# Patient Record
Sex: Female | Born: 1995
Health system: Southern US, Community
[De-identification: ages and names within clinical notes are randomized; demographics above are authoritative.]

## PROBLEM LIST (undated history)

## (undated) DIAGNOSIS — F32A Depression, unspecified: Secondary | ICD-10-CM

## (undated) DIAGNOSIS — R519 Headache, unspecified: Secondary | ICD-10-CM

## (undated) DIAGNOSIS — F329 Major depressive disorder, single episode, unspecified: Secondary | ICD-10-CM

## (undated) DIAGNOSIS — F419 Anxiety disorder, unspecified: Secondary | ICD-10-CM

## (undated) DIAGNOSIS — F900 Attention-deficit hyperactivity disorder, predominantly inattentive type: Secondary | ICD-10-CM

## (undated) DIAGNOSIS — R51 Headache: Secondary | ICD-10-CM

## (undated) HISTORY — DX: Anxiety disorder, unspecified: F41.9

## (undated) HISTORY — PX: NO PAST SURGERIES: SHX2092

## (undated) HISTORY — DX: Depression, unspecified: F32.A

## (undated) HISTORY — DX: Major depressive disorder, single episode, unspecified: F32.9

## (undated) HISTORY — DX: Attention-deficit hyperactivity disorder, predominantly inattentive type: F90.0

---
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
## 2013-04-25 ENCOUNTER — Encounter (HOSPITAL_COMMUNITY): Payer: Self-pay | Admitting: Emergency Medicine

## 2013-04-25 ENCOUNTER — Emergency Department (HOSPITAL_COMMUNITY)
Admission: EM | Admit: 2013-04-25 | Discharge: 2013-04-25 | Disposition: A | Discharge status: Home / Self Care | Payer: BC Managed Care – PPO | Source: Home / Self Care | Attending: Family Medicine | Admitting: Family Medicine

## 2013-04-25 DIAGNOSIS — F329 Major depressive disorder, single episode, unspecified: Secondary | ICD-10-CM

## 2013-04-25 MED ORDER — MIRTAZAPINE 15 MG PO TABS: 15.0000 mg | ORAL_TABLET | Freq: Every day | ORAL | Status: DC

## 2013-04-25 NOTE — ED Notes (Signed)
Patient has a history of depression, has ran out of medication.  Ran out of medication approx 2 weeks ago.  Patient is a Consulting civil engineer in the area.

## 2013-04-25 NOTE — ED Provider Notes (Signed)
Cassidy Livingston is a 17 y.o. female who presents to Urgent Care today for depression. Patient has a history of major depression. She is currently a Printmaker at [employer].  she ran out of Lexapro about 2 weeks ago. She notes that she was having side effects of Lexapro anyway. She notes decreased appetite with Lexapro and it was not working very well. She has a history of one suicide attempt where she took too many pills. This was about 2 years ago. She currently denies any active suicidal ideation but does have a passive death wish. She notes feelings of guilt, anhedonia, decreased appetite, sleeping too much. She is an appointment with student health counseling service in several days. She feels well otherwise. Her parents are actively involved in her life.   Positive family history for unipolar depression. No personal or family history for bipolar depression and schizophrenia  History reviewed. No pertinent past medical history. History  Substance Use Topics  . Smoking status: Never Smoker   . Smokeless tobacco: Not on file  . Alcohol Use: No   ROS as above Medications reviewed. No current facility-administered medications for this encounter.   Current Outpatient Prescriptions  Medication Sig Dispense Refill  . [DISCONTINUED] Escitalopram Oxalate (LEXAPRO PO) Take by mouth.      . mirtazapine (REMERON) 15 MG tablet Take 1 tablet (15 mg total) by mouth at bedtime.  30 tablet  0    Exam:  BP 120/85  Pulse 72  Temp(Src) 98.2 F (36.8 C) (Oral)  Resp 16  SpO2 100% Gen: Well NAD PSYCH:  Alert and oriented Normal affect Slightly fidgety Good eye contact Normal speech Thought process is linear and goal-directed Patient has good insight No active SI/HI or plan. She does have mild passive death wish.  No hallucinations or delusions.   PHQ9 20/28  Assessment and Plan: 17 y.o. female with major depression. Nearing severe. Plan to transition to mirtazapine as this may be her appetite and  may be more effective than SSRI.  Patient will start at 15 mg each bedtime. She would additionally will followup with the student health counseling service ASAP She agreed for a verbal contract for safety.  She may return any time if she is unable to get the help she needs Discussed warning signs or symptoms. Please see discharge instructions. Patient expresses understanding.     Rodolph Bong, MD 04/25/13 2158

## 2013-04-27 ENCOUNTER — Telehealth (HOSPITAL_COMMUNITY): Payer: Self-pay | Admitting: Family Medicine

## 2013-04-27 NOTE — ED Notes (Signed)
Called to check in about depression.  Left a message.    Rodolph Bong, MD 04/27/13 [phone]

## 2013-11-07 DIAGNOSIS — F32A Depression, unspecified: Secondary | ICD-10-CM | POA: Insufficient documentation

## 2013-11-07 DIAGNOSIS — F419 Anxiety disorder, unspecified: Secondary | ICD-10-CM | POA: Insufficient documentation

## 2013-11-07 DIAGNOSIS — F329 Major depressive disorder, single episode, unspecified: Secondary | ICD-10-CM | POA: Insufficient documentation

## 2014-04-21 ENCOUNTER — Emergency Department (HOSPITAL_COMMUNITY)
Admission: EM | Admit: 2014-04-21 | Discharge: 2014-04-22 | Disposition: A | Discharge status: Home / Self Care | Payer: BC Managed Care – PPO | Attending: Emergency Medicine | Admitting: Emergency Medicine

## 2014-04-21 ENCOUNTER — Encounter (HOSPITAL_COMMUNITY): Payer: Self-pay | Admitting: Emergency Medicine

## 2014-04-21 ENCOUNTER — Emergency Department (HOSPITAL_COMMUNITY): Payer: BC Managed Care – PPO

## 2014-04-21 DIAGNOSIS — Z3202 Encounter for pregnancy test, result negative: Secondary | ICD-10-CM | POA: Insufficient documentation

## 2014-04-21 DIAGNOSIS — R5383 Other fatigue: Secondary | ICD-10-CM | POA: Insufficient documentation

## 2014-04-21 DIAGNOSIS — J020 Streptococcal pharyngitis: Secondary | ICD-10-CM | POA: Diagnosis not present

## 2014-04-21 DIAGNOSIS — Z79899 Other long term (current) drug therapy: Secondary | ICD-10-CM | POA: Insufficient documentation

## 2014-04-21 DIAGNOSIS — R000 Tachycardia, unspecified: Secondary | ICD-10-CM | POA: Diagnosis not present

## 2014-04-21 DIAGNOSIS — R05 Cough: Secondary | ICD-10-CM | POA: Insufficient documentation

## 2014-04-21 DIAGNOSIS — H53149 Visual discomfort, unspecified: Secondary | ICD-10-CM | POA: Diagnosis not present

## 2014-04-21 DIAGNOSIS — T701XXA Sinus barotrauma, initial encounter: Secondary | ICD-10-CM | POA: Insufficient documentation

## 2014-04-21 DIAGNOSIS — R059 Cough, unspecified: Secondary | ICD-10-CM

## 2014-04-21 DIAGNOSIS — R110 Nausea: Secondary | ICD-10-CM | POA: Insufficient documentation

## 2014-04-21 DIAGNOSIS — R51 Headache: Secondary | ICD-10-CM | POA: Diagnosis present

## 2014-04-21 MED ORDER — ONDANSETRON HCL 4 MG/2ML IJ SOLN
4.0000 mg | Freq: Once | INTRAMUSCULAR | Status: AC
  Administered 2014-04-22: 4 mg via INTRAVENOUS
  Filled 2014-04-21: qty 2

## 2014-04-21 MED ORDER — SODIUM CHLORIDE 0.9 % IV BOLUS (SEPSIS)
1000.0000 mL | Freq: Once | INTRAVENOUS | Status: AC
  Administered 2014-04-21: 1000 mL via INTRAVENOUS

## 2014-04-21 MED ORDER — MORPHINE SULFATE 4 MG/ML IJ SOLN
4.0000 mg | Freq: Once | INTRAMUSCULAR | Status: AC
  Administered 2014-04-22: 4 mg via INTRAVENOUS
  Filled 2014-04-21: qty 1

## 2014-04-21 MED ORDER — IBUPROFEN 800 MG PO TABS
800.0000 mg | ORAL_TABLET | Freq: Once | ORAL | Status: AC
Start: 2014-04-21 — End: 2014-04-22
  Administered 2014-04-22: 800 mg via ORAL
  Filled 2014-04-21: qty 1

## 2014-04-21 NOTE — ED Provider Notes (Signed)
CSN: [id number]     Arrival date & time 04/21/14  2210 History   First MD Initiated Contact with Patient 04/21/14 2232     Chief Complaint  Patient presents with  . Headache     (Consider location/radiation/quality/duration/timing/severity/associated sxs/prior Treatment) HPI Comments: Patient is an otherwise healthy 18 year old female who presents emergency department for sudden onset of sore throat and generalized malaise. She reports that she has a frontal headache which has gradually worsened throughout the day. She has associated phonophobia and photophobia. She denies fever, but states she has chills. She has dry cough. She took a Tylenol at 9:15 PM. This did not improve her symptoms.  The history is provided by the patient. No language interpreter was used.    History reviewed. No pertinent past medical history. History reviewed. No pertinent past surgical history. History reviewed. No pertinent family history. History  Substance Use Topics  . Smoking status: Never Smoker   . Smokeless tobacco: Not on file  . Alcohol Use: No   OB History   Grav Para Term Preterm Abortions TAB SAB Ect Mult Living                 Review of Systems  Constitutional: Positive for fever, chills and fatigue.  HENT: Positive for sinus pressure and sore throat. Negative for trouble swallowing.   Eyes: Positive for photophobia. Negative for visual disturbance.  Respiratory: Positive for cough. Negative for shortness of breath.   Cardiovascular: Negative for chest pain.  Gastrointestinal: Positive for nausea. Negative for vomiting and abdominal pain.  Musculoskeletal: Negative for neck stiffness.  Neurological: Positive for headaches.  All other systems reviewed and are negative.     Allergies  Review of patient's allergies indicates no known allergies.  Home Medications   Prior to Admission medications   Medication Sig Start Date End Date Taking? Authorizing Provider  acetaminophen  (TYLENOL) 500 MG tablet Take 1,000 mg by mouth every 6 (six) hours as needed for moderate pain.   Yes Historical Provider, MD  MELATONIN ER PO Take 1 tablet by mouth at bedtime as needed (sleep).   Yes Historical Provider, MD  NUVARING 0.12-0.015 MG/24HR vaginal ring Place 1 each vaginally every 28 (twenty-eight) days.  04/17/14  Yes Historical Provider, MD  tacrolimus (PROTOPIC) 0.1 % ointment Apply 1 application topically at bedtime.   Yes Historical Provider, MD   BP 149/99  Pulse 123  Temp(Src) 98.4 F (36.9 C) (Oral)  Resp 20  Ht 5\' 1"  (1.549 m)  Wt 112 lb (50.803 kg)  BMI 21.17 kg/m2  SpO2 100%  LMP 04/05/2014 Physical Exam  Nursing note and vitals reviewed. Constitutional: She is oriented to person, place, and time. She appears well-developed and well-nourished. No distress.  NAD, non toxic, non septic in appearance.   HENT:  Head: Normocephalic and atraumatic.  Right Ear: External ear normal.  Left Ear: External ear normal.  Nose: Right sinus exhibits maxillary sinus tenderness. Right sinus exhibits no frontal sinus tenderness. Left sinus exhibits maxillary sinus tenderness. Left sinus exhibits no frontal sinus tenderness.  Mouth/Throat: Uvula is midline. Mucous membranes are dry. Oropharyngeal exudate, posterior oropharyngeal edema and posterior oropharyngeal erythema present. No tonsillar abscesses.  Bilateral tonsillar enlargement with exudate. No trismus, submental edema, or tongue elevation   Eyes: Conjunctivae and EOM are normal. Pupils are equal, round, and reactive to light.  Neck: Normal range of motion.  No nuchal rigidity or meningeal signs  Cardiovascular: Regular rhythm, normal heart sounds, intact distal  pulses and normal pulses.  Tachycardia present.   Pulses:      Radial pulses are 2+ on the right side, and 2+ on the left side.       Posterior tibial pulses are 2+ on the right side, and 2+ on the left side.  Pulmonary/Chest: Effort normal and breath sounds  normal. No stridor. No respiratory distress. She has no wheezes. She has no rales.  Abdominal: Soft. She exhibits no distension. There is no tenderness.  Musculoskeletal: Normal range of motion.  Neurological: She is alert and oriented to person, place, and time. She has normal strength. Coordination and gait normal. GCS eye subscore is 4. GCS verbal subscore is 5. GCS motor subscore is 6.  Skin: Skin is warm and dry. She is not diaphoretic. No erythema.  Psychiatric: She has a normal mood and affect. Her behavior is normal.    ED Course  Procedures (including critical care time) Labs Review Labs Reviewed  RAPID STREP SCREEN - Abnormal; Notable for the following:    Streptococcus, Group A Screen (Direct) POSITIVE (*)    All other components within normal limits  CBC WITH DIFFERENTIAL - Abnormal; Notable for the following:    WBC 14.0 (*)    HCT 35.2 (*)    Neutrophils Relative % 85 (*)    Neutro Abs 11.9 (*)    Lymphocytes Relative 10 (*)    All other components within normal limits  COMPREHENSIVE METABOLIC PANEL - Abnormal; Notable for the following:    Sodium 134 (*)    All other components within normal limits  URINALYSIS, ROUTINE W REFLEX MICROSCOPIC - Abnormal; Notable for the following:    Specific Gravity, Urine 1.034 (*)    Ketones, ur 40 (*)    All other components within normal limits  MONONUCLEOSIS SCREEN  I-STAT CG4 LACTIC ACID, ED  POC URINE PREG, ED    Imaging Review Dg Chest 2 View  04/22/2014   CLINICAL DATA:  Woke up this morning with sore throat, headache, body aches.  EXAM: CHEST  2 VIEW  COMPARISON:  None.  FINDINGS: The heart size and mediastinal contours are within normal limits. Both lungs are clear. The visualized skeletal structures are unremarkable.  IMPRESSION: No active cardiopulmonary disease.   Electronically Signed   By: Charlett NoseKevin  Dover M.D.   On: 04/22/2014 00:17     EKG Interpretation None      MDM   Final diagnoses:  Cough  Strep throat     Pt febrile with tonsillar exudate, cervical lymphadenopathy, & dysphagia; diagnosis of strep. Treated in the Ed with pain medication, fluids, and PO penicillin. Patient declined Bicillin injection.  Pt appears dehydrated. She is febrile and tachycardic. Responded well with fluids and tylenol. Discussed importance of fluid rehydration and antipyretics. Presentation non concerning for PTA or infxn spread to soft tissue. No trismus or uvula deviation. Specific return precautions discussed. Pt able to drink water in ED without difficulty with intact air way. Recommended PCP follow up. Patient is persistently tachycardic at shift change. Patient is responding well to fluids, but it appears patient has only received approximately 500ml. Patient signed out to [facility] LLC[facility]alski, PA-C at change of shift to ensure HR normalizes.      Mora BellmanHannah S Anysia Choi, PA-C 04/22/14 2008  Mora BellmanHannah S Maricsa Sammons, [place] JerseyPA-C 04/22/14 2010

## 2014-04-21 NOTE — ED Notes (Signed)
Pt woke up this am w/ sore throat, headache and body aches.  Pt endorsee sensitivity to sound, denies photophobia.

## 2014-04-22 LAB — URINALYSIS, ROUTINE W REFLEX MICROSCOPIC
Bilirubin Urine: NEGATIVE
Glucose, UA: NEGATIVE mg/dL
HGB URINE DIPSTICK: NEGATIVE
KETONES UR: 40 mg/dL — AB
Leukocytes, UA: NEGATIVE
Nitrite: NEGATIVE
Protein, ur: NEGATIVE mg/dL
Specific Gravity, Urine: 1.034 — ABNORMAL HIGH (ref 1.005–1.030)
UROBILINOGEN UA: 1 mg/dL (ref 0.0–1.0)
pH: 6.5 (ref 5.0–8.0)

## 2014-04-22 LAB — COMPREHENSIVE METABOLIC PANEL
ALK PHOS: 54 U/L (ref 39–117)
ALT: 8 U/L (ref 0–35)
AST: 15 U/L (ref 0–37)
Albumin: 3.9 g/dL (ref 3.5–5.2)
Anion gap: 15 (ref 5–15)
BUN: 10 mg/dL (ref 6–23)
CALCIUM: 8.9 mg/dL (ref 8.4–10.5)
CHLORIDE: 99 meq/L (ref 96–112)
CO2: 20 mEq/L (ref 19–32)
Creatinine, Ser: 0.74 mg/dL (ref 0.50–1.10)
GFR calc non Af Amer: >90 mL/min (ref >90)
Glucose, Bld: 91 mg/dL (ref 70–99)
Potassium: 3.7 mEq/L (ref 3.7–5.3)
Sodium: 134 mEq/L — ABNORMAL LOW (ref 137–147)
TOTAL PROTEIN: 7.1 g/dL (ref 6.0–8.3)
Total Bilirubin: 0.4 mg/dL (ref 0.3–1.2)

## 2014-04-22 LAB — CBC WITH DIFFERENTIAL/PLATELET
BASOS PCT: 0 % (ref 0–1)
Basophils Absolute: 0 10*3/uL (ref 0.0–0.1)
EOS ABS: 0.1 10*3/uL (ref 0.0–0.7)
Eosinophils Relative: 0 % (ref 0–5)
HEMATOCRIT: 35.2 % — AB (ref 36.0–46.0)
HEMOGLOBIN: 12.1 g/dL (ref 12.0–15.0)
LYMPHS ABS: 1.4 10*3/uL (ref 0.7–4.0)
LYMPHS PCT: 10 % — AB (ref 12–46)
MCH: 28 pg (ref 26.0–34.0)
MCHC: 34.4 g/dL (ref 30.0–36.0)
MCV: 81.5 fL (ref 78.0–100.0)
MONO ABS: 0.8 10*3/uL (ref 0.1–1.0)
Monocytes Relative: 5 % (ref 3–12)
NEUTROS ABS: 11.9 10*3/uL — AB (ref 1.7–7.7)
NEUTROS PCT: 85 % — AB (ref 43–77)
PLATELETS: 237 10*3/uL (ref 150–400)
RBC: 4.32 MIL/uL (ref 3.87–5.11)
RDW: 12.9 % (ref 11.5–15.5)
WBC: 14 10*3/uL — ABNORMAL HIGH (ref 4.0–10.5)

## 2014-04-22 LAB — MONONUCLEOSIS SCREEN: Mono Screen: NEGATIVE

## 2014-04-22 LAB — POC URINE PREG, ED: Preg Test, Ur: NEGATIVE

## 2014-04-22 LAB — I-STAT CG4 LACTIC ACID, ED: LACTIC ACID, VENOUS: 0.74 mmol/L (ref 0.5–2.2)

## 2014-04-22 LAB — RAPID STREP SCREEN (MED CTR MEBANE ONLY): Streptococcus, Group A Screen (Direct): POSITIVE — AB

## 2014-04-22 MED ORDER — SODIUM CHLORIDE 0.9 % IV BOLUS (SEPSIS)
1000.0000 mL | Freq: Once | INTRAVENOUS | Status: AC
  Administered 2014-04-22: 1000 mL via INTRAVENOUS

## 2014-04-22 MED ORDER — PENICILLIN G BENZATHINE 1200000 UNIT/2ML IM SUSP
1.2000 10*6.[IU] | Freq: Once | INTRAMUSCULAR | Status: DC
  Filled 2014-04-22: qty 2

## 2014-04-22 MED ORDER — TRAMADOL HCL 50 MG PO TABS: 50.0000 mg | ORAL_TABLET | Freq: Four times a day (QID) | ORAL | Status: DC | PRN

## 2014-04-22 MED ORDER — PENICILLIN V POTASSIUM 500 MG PO TABS
500.0000 mg | ORAL_TABLET | Freq: Once | ORAL | Status: AC
  Administered 2014-04-22: 500 mg via ORAL
  Filled 2014-04-22: qty 1

## 2014-04-22 MED ORDER — ACETAMINOPHEN 500 MG PO TABS
1000.0000 mg | ORAL_TABLET | Freq: Once | ORAL | Status: AC
  Administered 2014-04-22: 1000 mg via ORAL
  Filled 2014-04-22: qty 2

## 2014-04-22 MED ORDER — PENICILLIN V POTASSIUM 500 MG PO TABS: 500.0000 mg | ORAL_TABLET | Freq: Four times a day (QID) | ORAL | Status: DC

## 2014-04-22 NOTE — ED Notes (Signed)
Patient transported to X-ray 

## 2014-04-22 NOTE — ED Notes (Signed)
Awake. Verbally responsive. Resp even and unlabored. No audible adventitious breath sounds noted at this time. ABC's intact. NAD noted.

## 2014-04-22 NOTE — Discharge Instructions (Signed)

## 2014-04-22 NOTE — ED Provider Notes (Signed)
Medical screening examination/treatment/procedure(s) were performed by non-physician practitioner and as supervising physician I was immediately available for consultation/collaboration.   EKG Interpretation None        Tomasita CrumbleAdeleke Abdirahim Flavell, MD 04/22/14 2255

## 2014-07-15 ENCOUNTER — Encounter (HOSPITAL_COMMUNITY): Payer: Self-pay | Admitting: Emergency Medicine

## 2015-01-23 IMAGING — CR DG CHEST 2V
2 series · 2 of 2 positions shown · non-contrast
Comparison: None.

CLINICAL DATA: Woke up this morning with sore throat, headache,
body aches.

EXAM:
CHEST  2 VIEW

[w chest pa]
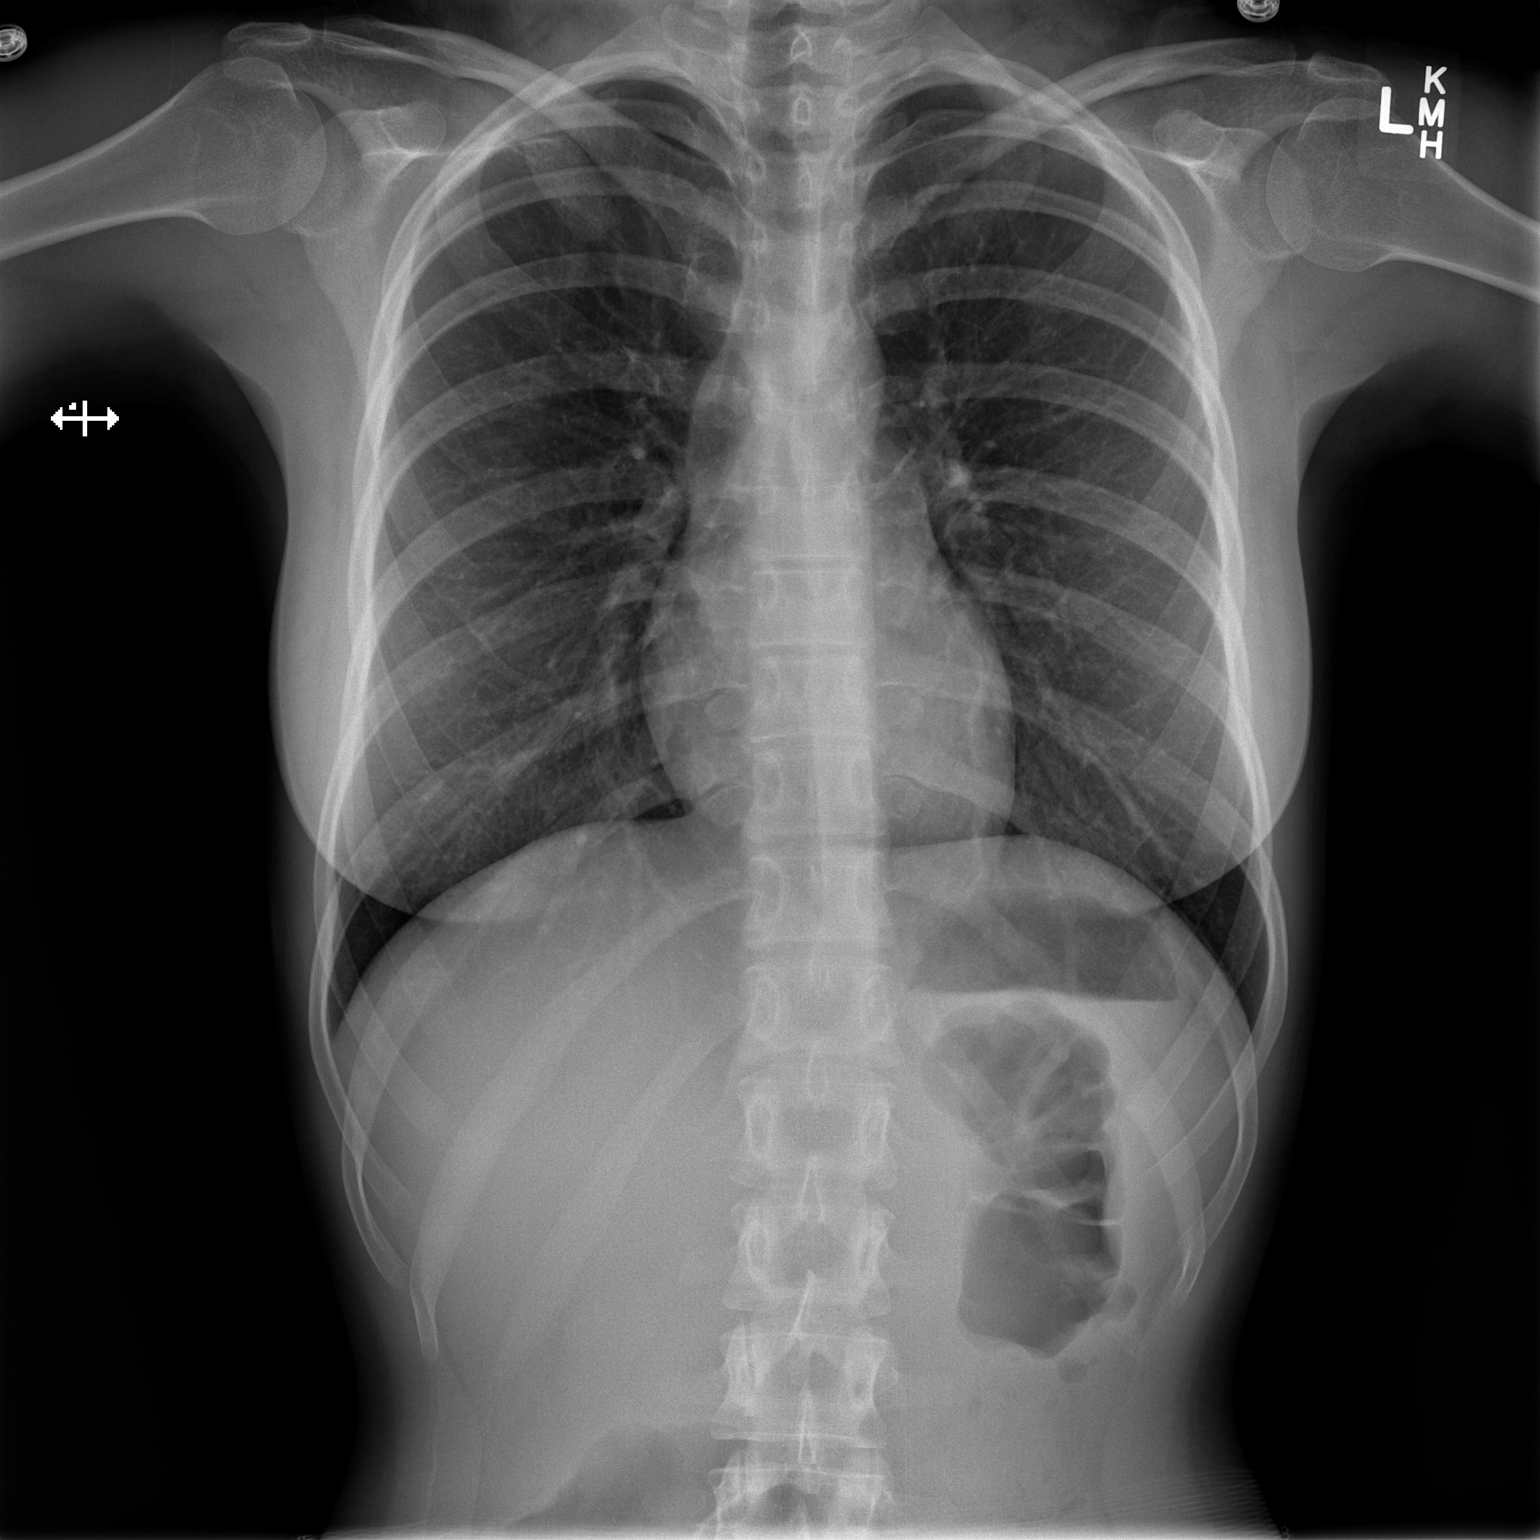

[w chest lat]
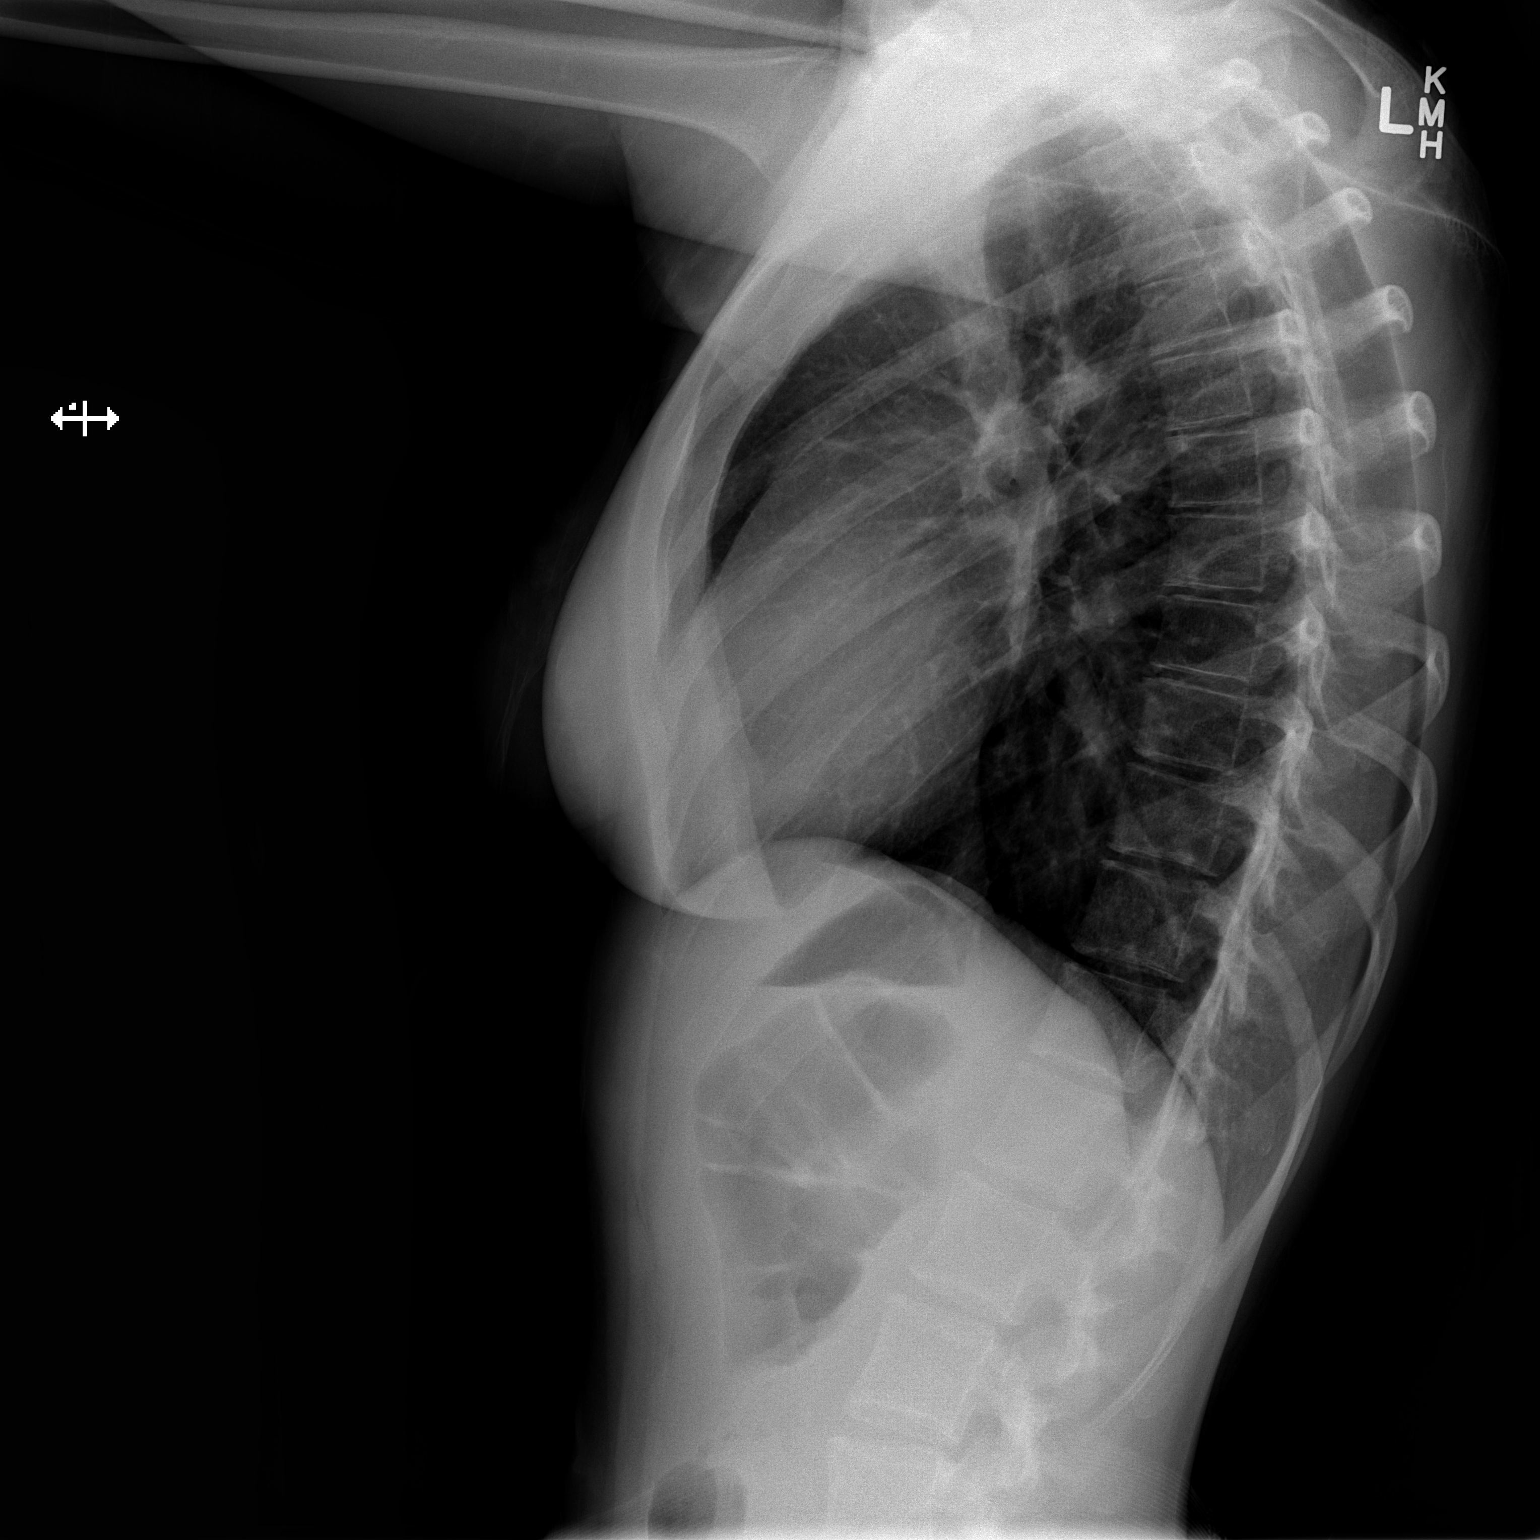

[2 of 2 positions shown; findings below may reference images not displayed]

FINDINGS: The heart size and mediastinal contours are within normal limits.
Both lungs are clear. The visualized skeletal structures are
unremarkable.
IMPRESSION: No active cardiopulmonary disease.

## 2017-03-28 ENCOUNTER — Encounter (HOSPITAL_COMMUNITY): Payer: Self-pay | Admitting: Behavioral Health

## 2017-03-28 ENCOUNTER — Ambulatory Visit (HOSPITAL_COMMUNITY)
Admission: RE | Admit: 2017-03-28 | Discharge: 2017-03-28 | Disposition: A | Discharge status: Home / Self Care | Payer: Self-pay | Attending: Psychiatry | Admitting: Psychiatry

## 2017-03-28 NOTE — H&P (Signed)
Behavioral Health Medical Screening Exam  Cassidy Livingston is an 21 y.o. female who arrived voluntarily unaccompanied to [facility] requesting for predication Rx. Patient became tearful during this encounter, she stated that she feels alone. Her parents and brother live in [place]. Patient recently broke up with her boyfriend about 3 weeks ago. She currently denies any SI/HI/VAH, she agrees to follow up with OP resources for medication management about her depression. Patient contracts for safety.  Total Time spent with patient: 30 minutes  Psychiatric Specialty Exam: Physical Exam  Constitutional: She is oriented to person, place, and time. She appears well-developed and well-nourished.  HENT:  Head: Normocephalic.  Eyes: Pupils are equal, round, and reactive to light.  Neck: Normal range of motion. Neck supple.  Cardiovascular: Normal rate, regular rhythm and normal heart sounds.   Respiratory: Effort normal and breath sounds normal.  GI: Soft. Bowel sounds are normal.  Musculoskeletal: Normal range of motion.  Neurological: She is alert and oriented to person, place, and time.  Skin: Skin is warm and dry.    Review of Systems  Psychiatric/Behavioral: Positive for depression. Negative for hallucinations, memory loss, substance abuse and suicidal ideas. The patient is nervous/anxious. The patient does not have insomnia.   All other systems reviewed and are negative.   Blood pressure 125/81, pulse 78, temperature 98.9 F (37.2 C), temperature source Oral, resp. rate 18, SpO2 100 %.There is no height or weight on file to calculate BMI.  General Appearance: Casual and Well Groomed  Eye Contact:  Good  Speech:  Clear and Coherent and Normal Rate  Volume:  Normal  Mood:  Anxious and Depressed  Affect:  Depressed and Tearful  Thought Process:  Coherent and Goal Directed  Orientation:  Full (Time, Place, and Person)  Thought Content:  WDL and Logical  Suicidal Thoughts:  No  Homicidal  Thoughts:  No  Memory:  Immediate;   Good Recent;   Good Remote;   Good  Judgement:  Good  Insight:  Good  Psychomotor Activity:  Normal  Concentration: Concentration: Good and Attention Span: Good  Recall:  Good  Fund of Knowledge:Good  Language: Good  Akathisia:  Negative  Handed:  Right  AIMS (if indicated):     Assets:  Communication Skills Desire for Improvement Financial Resources/Insurance Housing Leisure Time Physical Health Resilience Social Support Talents/Skills Vocational/Educational  Sleep:       Musculoskeletal: Strength & Muscle Tone: within normal limits Gait & Station: normal Patient leans: N/A  Blood pressure 125/81, pulse 78, temperature 98.9 F (37.2 C), temperature source Oral, resp. rate 18, SpO2 100 %.  Recommendations:  Based on my evaluation the patient does not appear to have an emergency medical condition.  Delila Pereyra, NP 03/28/2017, 5:40 PM

## 2017-03-28 NOTE — BH Assessment (Signed)
Assessment Note  Cassidy Livingston is a 21 y.o. female who presented to [facility] as a voluntary walk-in with complaint of depressive symptoms and anxiety.  Pt requested assistance in restarting psychotropic medications.  Pt provided history.  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  She lives in an apartment with several apartment-mates.  Pt reported that she has a history of therapy and psychiatric treatment for depression, anxiety, and ADHD.  Pt reported that she has not been on psychotropic medication for several years.  Recently, her boyfriend broke up with her, and since then, Pt has experienced an increase in the following symptoms:  Persistent and unremitting despondency; insomnia; appetite (lost six pounds over two weeks); isolation; tearfulness; anxiety; worthlessness and hopelessness.  Pt also reported vegetative disturbance -- difficulty getting out of bed.  Pt reported that she also experiences passive suicidal ideation.  She stated that about two weeks ago, she intentionally overdosed on sleeping pills and ibuprofen -- it was not a suicide attempt, but she stated that she would not mind if she did not wake up.  Pt denied current suicidal ideation, homicidal ideation, auditory/visual hallucination, and self-injurious behavior.  Pt reported that she [facility] (therapist is [facility]).  Pt stated that she is not receiving psychiatric services.  She reported that she was treated inpatient at [facility] her junior year of high school.  Pt has family members in [place], and they provide support for her.  During assessment, Pt presented as alert and oriented.  She had good eye contact and was cooperative.  Pt was dressed in street clothes and appeared appropriately groomed.  Pt's mood was sad.  Affect was mood-congruent.  Pt endorsed passive suicidal ideation (although not currently);  despondency; anxiety, and other depressive symptoms.  She also endorsed a history of ADHD.  Pt denied current SI, HI, or auditory/visual hallucination.  Pt's speech was normal in rate, rhythm, and volume.  Pt's thought processes were within normal range, and thought content was logical and goal-oriented.  There was no evidence of delusion.  Pt's memory and concentration were intact.  Pt's insight and judgment were fair.  Impulse control was deemed poor as evidenced by overdose last week.  Pt requested assistance in securing psychotropic meds to help with despondency due to recent break-up.  Consulted with Julian Hy, NP who determined that Pt does not meet inpatient criteria.  Author provided Pt with psychotropic medication.  Diagnosis: Major Depressive Disorder, Recurrent, Moderate  Past Medical History: No past medical history on file.  No past surgical history on file.  Family History: No family history on file.  Social History:  reports that she has never smoked. She does not have any smokeless tobacco history on file. She reports that she drinks alcohol. She reports that she does not use drugs.  Additional Social History:  Alcohol / Drug Use Pain Medications: See MAR Prescriptions: See MAR Over the Counter: See MAR History of alcohol / drug use?: Yes Substance #1 Name of Substance 1: Alcohol 1 - Amount (size/oz): Varied 1 - Frequency: Episodic 1 - Duration: Ongoing  CIWA: CIWA-Ar BP: 125/81 Pulse Rate: 78 COWS:    Allergies: No Known Allergies  Home Medications:  (Not in a hospital admission)  OB/GYN Status:  No LMP recorded.  General Assessment Data Location of Assessment: [facility] Assessment Services TTS Assessment: In system Is this a Tele or Face-to-Face Assessment?: Face-to-Face Is this  an Initial Assessment or a Re-assessment for this encounter?: Initial Assessment Marital status: Single Is patient pregnant?: No Pregnancy Status: No Living Arrangements: Other  (Comment) (Lives in apartment; has roommates) Can pt return to current living arrangement?: Yes Admission Status: Voluntary Is patient capable of signing voluntary admission?: Yes Referral Source: Self/Family/Friend Insurance type: Scientist, research (physical sciences) Exam [facility] Walk-in ONLY) Medical Exam completed: Yes  Crisis Care Plan Living Arrangements: Other (Comment) (Lives in apartment; has roommates) Name of Psychiatrist: None currently Name of Therapist: Engineer, manufacturing systems, [facility]  Education Status Is patient currently in school?: Yes Current Grade: Junior Highest grade of school patient has completed: Sophomore Name of school: [employer]  Risk to self with the past 6 months Suicidal Ideation: No-Not Currently/Within Last 6 Months Has patient been a risk to self within the past 6 months prior to admission? : Yes Suicidal Intent: No Has patient had any suicidal intent within the past 6 months prior to admission? : Yes Is patient at risk for suicide?: No Suicidal Plan?: No Has patient had any suicidal plan within the past 6 months prior to admission? : No Access to Means: No What has been your use of drugs/alcohol within the last 12 months?: Alcohol Previous Attempts/Gestures: Yes How many times?: 1 Triggers for Past Attempts: Other (Comment) (Breakup with boyfriend) Intentional Self Injurious Behavior: None Family Suicide History: No Recent stressful life event(s): Other (Comment) (Recent breakup with boyfriend) Persecutory voices/beliefs?: No Depression: Yes Depression Symptoms: Despondent, Insomnia, Tearfulness, Isolating, Loss of interest in usual pleasures, Feeling worthless/self pity Substance abuse history and/or treatment for substance abuse?: No Suicide prevention information given to non-admitted patients: Not applicable  Risk to Others within the past 6 months Homicidal Ideation: No Does patient have any lifetime risk of violence toward others  beyond the six months prior to admission? : No Thoughts of Harm to Others: No Current Homicidal Intent: No Current Homicidal Plan: No Access to Homicidal Means: No History of harm to others?: No Assessment of Violence: None Noted Does patient have access to weapons?: No Criminal Charges Pending?: No Does patient have a court date: No Is patient on probation?: No  Psychosis Hallucinations: None noted Delusions: None noted  Mental Status Report Appearance/Hygiene: Unremarkable (Street clothes) Eye Contact: Good Motor Activity: Freedom of movement, Unremarkable Speech: Logical/coherent Level of Consciousness: Alert Mood: Sad Affect: Appropriate to circumstance Anxiety Level: None Thought Processes: Coherent, Relevant Judgement: Partial Orientation: Person, Place, Time, Situation Obsessive Compulsive Thoughts/Behaviors: None  Cognitive Functioning Concentration: Normal Memory: Recent Intact, Remote Intact IQ: Average Insight: Fair Impulse Control: Poor Appetite: Poor Weight Loss: 6 (six lbs over 2 weeks) Sleep: Decreased Vegetative Symptoms: Staying in bed  ADLScreening [facility] Assessment Services) Patient's cognitive ability adequate to safely complete daily activities?: Yes Patient able to express need for assistance with ADLs?: Yes Independently performs ADLs?: Yes (appropriate for developmental age)  Prior Inpatient Therapy Prior Inpatient Therapy: Yes Prior Therapy Dates: 2009 Prior Therapy Facilty/Provider(s): [facility] Reason for Treatment: Depression  Prior Outpatient Therapy Prior Outpatient Therapy: Yes Prior Therapy Dates: Ongoing Prior Therapy Facilty/Provider(s): [facility] Reason for Treatment: Depression, Anxiety, ADHD Does patient have an ACCT team?: No Does patient have Intensive In-House Services?  : No Does patient have [facility] services? : No Does patient have P4CC services?: No  ADL Screening (condition at time  of admission) Patient's cognitive ability adequate to safely complete daily activities?: Yes Is the patient deaf or have difficulty hearing?: No Does the patient have  difficulty seeing, even when wearing glasses/contacts?: No Does the patient have difficulty concentrating, remembering, or making decisions?: No Patient able to express need for assistance with ADLs?: Yes Does the patient have difficulty dressing or bathing?: No Independently performs ADLs?: Yes (appropriate for developmental age) Does the patient have difficulty walking or climbing stairs?: No Weakness of Legs: None Weakness of Arms/Hands: None     Therapy Consults (therapy consults require a physician order) PT Evaluation Needed: No OT Evalulation Needed: No SLP Evaluation Needed: No Abuse/Neglect Assessment (Assessment to be complete while patient is alone) Physical Abuse: Denies Verbal Abuse: Denies Sexual Abuse: Denies Exploitation of patient/patient's resources: Denies Self-Neglect: Denies Values / Beliefs Cultural Requests During Hospitalization: None Spiritual Requests During Hospitalization: None Consults Spiritual Care Consult Needed: No Social Work Consult Needed: No Merchant navy officer (For Healthcare) Does Patient Have a Medical Advance Directive?: No    Additional Information 1:1 In Past 12 Months?: No CIRT Risk: No Elopement Risk: No Does patient have medical clearance?: Yes     Disposition:  Disposition Initial Assessment Completed for this Encounter: Yes Disposition of Patient: Referred to Patient referred to: Other (Comment) (Referred to o/p psych services; resources provided)  On Site Evaluation by:   Reviewed with Physician:    Earline Mayotte 03/28/2017 5:49 PM

## 2017-04-02 ENCOUNTER — Encounter: Payer: Self-pay | Admitting: Physician Assistant

## 2017-04-02 ENCOUNTER — Ambulatory Visit (INDEPENDENT_AMBULATORY_CARE_PROVIDER_SITE_OTHER): Payer: BLUE CROSS/BLUE SHIELD | Admitting: Physician Assistant

## 2017-04-02 VITALS — HR 92 | Temp 98.8°F | Resp 16 | Ht 61.0 in | Wt 116.4 lb

## 2017-04-02 DIAGNOSIS — F329 Major depressive disorder, single episode, unspecified: Secondary | ICD-10-CM | POA: Diagnosis not present

## 2017-04-02 DIAGNOSIS — Z23 Encounter for immunization: Secondary | ICD-10-CM

## 2017-04-02 DIAGNOSIS — Z3009 Encounter for other general counseling and advice on contraception: Secondary | ICD-10-CM | POA: Diagnosis not present

## 2017-04-02 DIAGNOSIS — F900 Attention-deficit hyperactivity disorder, predominantly inattentive type: Secondary | ICD-10-CM | POA: Diagnosis not present

## 2017-04-02 DIAGNOSIS — F419 Anxiety disorder, unspecified: Secondary | ICD-10-CM

## 2017-04-02 DIAGNOSIS — F32A Depression, unspecified: Secondary | ICD-10-CM

## 2017-04-02 MED ORDER — CLONAZEPAM 0.5 MG PO TABS: 0.5000 mg | ORAL_TABLET | Freq: Two times a day (BID) | ORAL | 1 refills | Status: DC | PRN

## 2017-04-02 MED ORDER — VENLAFAXINE HCL ER 37.5 MG PO CP24: ORAL_CAPSULE | ORAL | 1 refills | Status: DC

## 2017-04-02 MED ORDER — ETONOGESTREL-ETHINYL ESTRADIOL 0.12-0.015 MG/24HR VA RING: 1.0000 | VAGINAL_RING | VAGINAL | 12 refills | Status: DC

## 2017-04-02 NOTE — Assessment & Plan Note (Signed)
Discussed treatment options. Select venlafaxine, with PRN clonazepam for the initial 2-3 weeks.

## 2017-04-02 NOTE — Assessment & Plan Note (Signed)
Discussed treatment options. Select venlafaxine, with PRN clonazepam for the initial 2-3 weeks.  

## 2017-04-02 NOTE — Progress Notes (Signed)
Patient ID: Cassidy Livingston, female     DOB: 05-Feb-1996, 21 y.o.    MRN: [id number]  PCP: System, Pcp Not In  Chief Complaint  Patient presents with  . Establish Care    anxiety     Subjective:   This patient is new to me and presents for evaluation of anxiety, on the recommendation of her therapist, Kara Dies. Next visit is 04/06/2017, 2 pm  "Really bad state lately. Been meaning for a while (years) to get back on medication. Anxiety has become crippling." ADHD has worsened and interferes with her ability to do well in school.  ADHD is predominantly inattentive type, initially diagnosed in 5th grade. Dx with depression in early HS.  Took "Zoloft and a bunch of other random depression medications that I can't remember off the top of my head." Doesn't recall what she took for ADHD, but thinks it was off-brand Adderall (thinks that it made it worse, worked really well x 1 hour, then felt "like, blank for the rest of the day."). Doesn't recall anything that worked particularly well, and some things made her symptoms worse.  Is not currently using anything to prevent pregnancy or STI. Has previously used NuvaRing, which she liked. Is interested in Nexplanon.   Review of Systems  Constitutional: Negative.   HENT: Negative for sore throat.   Eyes: Negative for visual disturbance.  Respiratory: Negative for cough, chest tightness, shortness of breath and wheezing.   Cardiovascular: Negative for chest pain and palpitations.  Gastrointestinal: Negative for abdominal pain, diarrhea, nausea and vomiting.  Genitourinary: Negative for dysuria, frequency, hematuria and urgency.  Musculoskeletal: Negative for arthralgias and myalgias.  Skin: Negative for rash.  Neurological: Negative for dizziness, weakness and headaches.  Psychiatric/Behavioral: Positive for decreased concentration, dysphoric mood and suicidal ideas (no intention or plan). Negative for self-injury and sleep  disturbance. The patient is nervous/anxious.    Depression screen PHQ 2/9 04/02/2017  Decreased Interest 3  Down, Depressed, Hopeless 3  PHQ - 2 Score 6  Altered sleeping 2  Tired, decreased energy 3  Change in appetite 3  Feeling bad or failure about yourself  3  Trouble concentrating 3  Moving slowly or fidgety/restless 1  Suicidal thoughts 1  PHQ-9 Score 22  Difficult doing work/chores Very difficult     Prior to Admission medications   Medication Sig Start Date End Date Taking? Authorizing Provider  acetaminophen (TYLENOL) 500 MG tablet Take 1,000 mg by mouth every 6 (six) hours as needed for moderate pain.    [provider]     No Known Allergies   There are no active problems to display for this patient.    Family History  Problem Relation Age of Onset  . Diabetes Mother   . Heart disease Maternal Grandmother      Social History   Social History  . Marital status: Single    Spouse name: n/a  . Number of children: 0  . Years of education: N/A   Occupational History  . student     [employer], anticipated graduation 05/2018   Social History Main Topics  . Smoking status: Never Smoker  . Smokeless tobacco: Never Used  . Alcohol use 0.0 - 2.4 oz/week     Comment: Varied; BAC not available  . Drug use: No  . Sexual activity: Yes    Partners: Male    Birth control/ protection: None   Other Topics Concern  . Not on file  Social History Narrative   Grew up in [place], [place].   Her family lives in [place], [place].   Lives off campus with roommates.             Objective:  Physical Exam  Constitutional: She is oriented to person, place, and time. She appears well-developed and well-nourished. She is active and cooperative. No distress.  Pulse 92   Temp 98.8 F (37.1 C)   Resp 16   Ht  (1.549 m)   Wt 116 lb 6.4 oz (52.8 kg)   LMP 03/23/2017   SpO2 98%   BMI 21.99 kg/m    Eyes: Conjunctivae are normal.    Pulmonary/Chest: Effort normal.  Neurological: She is alert and oriented to person, place, and time.  Psychiatric: She has a normal mood and affect. Her speech is normal and behavior is normal.       Assessment & Plan:   Problem List Items Addressed This Visit    Anxiety - Primary    Discussed treatment options. Select venlafaxine, with PRN clonazepam for the initial 2-3 weeks.       Relevant Medications   venlafaxine XR (EFFEXOR-XR) 37.5 MG 24 hr capsule   clonazePAM (KLONOPIN) 0.5 MG tablet   Depression    Discussed treatment options. Select venlafaxine, with PRN clonazepam for the initial 2-3 weeks.       Relevant Medications   venlafaxine XR (EFFEXOR-XR) 37.5 MG 24 hr capsule   ADHD, predominantly inattentive type    Hold off on reinitiating ADHD treatment to see how much improvement she gets with treating the anxiety and depression. In the interim, she'll see if she can get previous records.       Other Visit Diagnoses    Need for prophylactic vaccination and inoculation against influenza       Relevant Orders   Flu Vaccine QUAD 6+ mos PF IM (Fluarix Quad PF) (Completed)   Encounter for other general counseling or advice on contraception       Resume NuvaRing until we can get prior authorization for Nexplanon. Once that is approved, she will come in during her next period for placement.   Relevant Medications   etonogestrel-ethinyl estradiol (NUVARING) 0.12-0.015 MG/24HR vaginal ring       Return for 4-6 week re-evaluation of anxiety, depression and ADHD.   Fernande Bras, PA-C Primary Care at [facility]

## 2017-04-02 NOTE — Patient Instructions (Signed)
     IF you received an x-ray today, you will receive an invoice from [facility]. Please contact [facility] at [phone] with questions or concerns regarding your invoice.   IF you received labwork today, you will receive an invoice from [facility]. Please contact [facility] at [phone] with questions or concerns regarding your invoice.   Our billing staff will not be able to assist you with questions regarding bills from these companies.  You will be contacted with the lab results as soon as they are available. The fastest way to get your results is to activate your My Chart account. Instructions are located on the last page of this paperwork. If you have not heard from us regarding the results in 2 weeks, please contact this office.     

## 2017-04-02 NOTE — Assessment & Plan Note (Signed)
Hold off on reinitiating ADHD treatment to see how much improvement she gets with treating the anxiety and depression. In the interim, she'll see if she can get previous records.

## 2017-04-30 ENCOUNTER — Ambulatory Visit (INDEPENDENT_AMBULATORY_CARE_PROVIDER_SITE_OTHER): Payer: BLUE CROSS/BLUE SHIELD | Admitting: Physician Assistant

## 2017-04-30 ENCOUNTER — Encounter: Payer: Self-pay | Admitting: Physician Assistant

## 2017-04-30 VITALS — BP 102/80 | HR 88 | Temp 98.7°F | Resp 18 | Ht 61.0 in | Wt 112.8 lb

## 2017-04-30 DIAGNOSIS — F329 Major depressive disorder, single episode, unspecified: Secondary | ICD-10-CM | POA: Diagnosis not present

## 2017-04-30 DIAGNOSIS — F900 Attention-deficit hyperactivity disorder, predominantly inattentive type: Secondary | ICD-10-CM | POA: Diagnosis not present

## 2017-04-30 DIAGNOSIS — F32A Depression, unspecified: Secondary | ICD-10-CM

## 2017-04-30 DIAGNOSIS — F419 Anxiety disorder, unspecified: Secondary | ICD-10-CM

## 2017-04-30 MED ORDER — LISDEXAMFETAMINE DIMESYLATE 30 MG PO CAPS: 30.0000 mg | ORAL_CAPSULE | Freq: Every day | ORAL | 0 refills | Status: DC

## 2017-04-30 MED ORDER — CLONAZEPAM 0.5 MG PO TABS: 0.5000 mg | ORAL_TABLET | Freq: Two times a day (BID) | ORAL | 0 refills | Status: DC | PRN

## 2017-04-30 MED ORDER — BUPROPION HCL ER (XL) 150 MG PO TB24: 150.0000 mg | ORAL_TABLET | Freq: Every day | ORAL | 0 refills | Status: DC

## 2017-04-30 NOTE — Assessment & Plan Note (Signed)
STOP venlafaxine, due to increased depressive symptoms. Re-Try bupropion. Continue PRN clonazepam.

## 2017-04-30 NOTE — Progress Notes (Signed)
Patient ID: Cassidy Livingston, female    DOB: [birth year], 21 y.o.   MRN: [id number]  PCP: System, Pcp Not In  Chief Complaint  Patient presents with  . Anxiety    GAD 7 score 16  . Depression    Depression scale score 22  . ADHD  . Follow-up    Subjective:   Presents for evaluation of anxiety, depression and ADHD.  At her previous visit with me, our first, we started venlafaxine xr, as multiple SSRI's and bupropion had been ineffective. We elected to try to address the mood disorder and then see what attention problem persisted. Trying to get records from previous provider regarding ADHD treatment. Knows that she took an off-brand of Adderall, she thinks it was short acting.  She found that while the venlafaxine has really helped her anxiety symptoms, her depression worsened. She has experienced a decline in motivation such that she just wants to stay in bed and sleep. She cannot always get herself up out of bed to go to class. She is able to get up for rehearsal, but even that is a struggle. No thoughts of harming herself or others. Clonazepam helps "take the edge off" and is not sedating.  She has been more consistent with taking the venlafaxine than with any other medication for mood in the past, and she wonders if the previous ineffectiveness is due to that.   Review of Systems No chest pain, SOB, HA, dizziness, vision change, N/V, diarrhea, constipation, dysuria, urinary urgency or frequency, myalgias, arthralgias or rash.  Depression screen [facility] 2/9 04/30/2017 04/02/2017  Decreased Interest 3 3  Down, Depressed, Hopeless 3 3  PHQ - 2 Score 6 6  Altered sleeping 3 2  Tired, decreased energy 3 3  Change in appetite 3 3  Feeling bad or failure about yourself  3 3  Trouble concentrating 1 3  Moving slowly or fidgety/restless 3 1  Suicidal thoughts 1 1  PHQ-9 Score 23 22  Difficult doing work/chores Extremely dIfficult Very difficult    GAD 7 : Generalized Anxiety  Score 04/30/2017  Nervous, Anxious, on Edge 3  Control/stop worrying 3  Worry too much - different things 3  Trouble relaxing 3  Restless 2  Easily annoyed or irritable 1  Afraid - awful might happen 1  Total GAD 7 Score 16  Anxiety Difficulty Somewhat difficult       Patient Active Problem List   Diagnosis Date Noted  . ADHD, predominantly inattentive type 04/02/2017  . Anxiety 11/07/2013  . Depression 11/07/2013     Prior to Admission medications   Medication Sig Start Date End Date Taking? Authorizing Provider  acetaminophen (TYLENOL) 500 MG tablet Take 1,000 mg by mouth every 6 (six) hours as needed for moderate pain.   Yes [provider]  clonazePAM (KLONOPIN) 0.5 MG tablet Take 1 tablet (0.5 mg total) by mouth 2 (two) times daily as needed for anxiety. 04/02/17  Yes Taiga Lupinacci, Avelino Leedshelle, PA-C  etonogestrel-ethinyl estradiol (NUVARING) 0.12-0.015 MG/24HR vaginal ring Place 1 each vaginally every 28 (twenty-eight) days. 04/02/17  Yes Antania Hoefling, PA-C  venlafaxine XR (EFFEXOR-XR) 37.5 MG 24 hr capsule Take 37.5 mg daily for 1-2 weeks, then take 75 mg daily 04/02/17  Yes Makaylyn Sinyard, PA-C     No Known Allergies     Objective:  Physical Exam  Constitutional: She is oriented to person, place, and time. She appears well-developed and well-nourished. She is active and cooperative. No distress.  BP 102/80 (BP Location: Left Arm, Patient Position: Sitting, Cuff Size: Normal)   Pulse 88   Temp 98.7 F (37.1 C) (Oral)   Resp 18   Ht 5\' 1"  (1.549 m)   Wt 112 lb 12.8 oz (51.2 kg)   LMP 04/20/2017   SpO2 97%   BMI 21.31 kg/m   HENT:  Head: Normocephalic and atraumatic.  Right Ear: Hearing normal.  Left Ear: Hearing normal.  Eyes: Conjunctivae are normal. No scleral icterus.  Neck: Normal range of motion. Neck supple. No thyromegaly present.  Cardiovascular: Normal rate, regular rhythm and normal heart sounds.   Pulses:      Radial pulses are 2+ on the right  side, and 2+ on the left side.  Pulmonary/Chest: Effort normal and breath sounds normal.  Lymphadenopathy:       Head (right side): No tonsillar, no preauricular, no posterior auricular and no occipital adenopathy present.       Head (left side): No tonsillar, no preauricular, no posterior auricular and no occipital adenopathy present.    She has no cervical adenopathy.       Right: No supraclavicular adenopathy present.       Left: No supraclavicular adenopathy present.  Neurological: She is alert and oriented to person, place, and time. No sensory deficit.  Skin: Skin is warm, dry and intact. No rash noted. No cyanosis or erythema. Nails show no clubbing.  Psychiatric: Her speech is normal and behavior is normal. Judgment and thought content normal. Her mood appears anxious. Her affect is not angry, not blunt, not labile and not inappropriate. Cognition and memory are normal. She does not exhibit a depressed mood.      Assessment & Plan:   Problem List Items Addressed This Visit    Anxiety - Primary    STOP venlafaxine, due to increased depressive symptoms. Re-Try bupropion. Continue PRN clonazepam.      Relevant Medications   clonazePAM (KLONOPIN) 0.5 MG tablet   buPROPion (WELLBUTRIN XL) 150 MG 24 hr tablet   Depression    STOP venlafaxine, due to increasing depression, despite improved anxiety. Re-Try bupropion.      Relevant Medications   buPROPion (WELLBUTRIN XL) 150 MG 24 hr tablet   ADHD, predominantly inattentive type    Try Vyvanse.      Relevant Medications   lisdexamfetamine (VYVANSE) 30 MG capsule       Return in about 4 weeks (around 05/28/2017) for re-evaluation of mood, motivation and attention.   Fernande Bras, PA-C Primary Care at [facility]

## 2017-04-30 NOTE — Assessment & Plan Note (Signed)
STOP venlafaxine, due to increasing depression, despite improved anxiety. Re-Try bupropion.

## 2017-04-30 NOTE — Patient Instructions (Signed)
     IF you received an x-ray today, you will receive an invoice from [facility]. Please contact [facility] at [phone] with questions or concerns regarding your invoice.   IF you received labwork today, you will receive an invoice from [facility]. Please contact [facility] at [phone] with questions or concerns regarding your invoice.   Our billing staff will not be able to assist you with questions regarding bills from these companies.  You will be contacted with the lab results as soon as they are available. The fastest way to get your results is to activate your My Chart account. Instructions are located on the last page of this paperwork. If you have not heard from us regarding the results in 2 weeks, please contact this office.     

## 2017-04-30 NOTE — Assessment & Plan Note (Signed)
Try Vyvanse.

## 2017-05-28 ENCOUNTER — Ambulatory Visit: Payer: BLUE CROSS/BLUE SHIELD | Admitting: Physician Assistant

## 2017-08-17 ENCOUNTER — Encounter (HOSPITAL_COMMUNITY): Payer: Self-pay | Admitting: Nurse Practitioner

## 2017-08-17 ENCOUNTER — Emergency Department (HOSPITAL_COMMUNITY)
Admission: EM | Admit: 2017-08-17 | Discharge: 2017-08-18 | Disposition: A | Discharge status: Other Acute Inpatient Hospital | Payer: BLUE CROSS/BLUE SHIELD | Attending: Emergency Medicine | Admitting: Emergency Medicine

## 2017-08-17 ENCOUNTER — Other Ambulatory Visit: Payer: Self-pay

## 2017-08-17 DIAGNOSIS — Z046 Encounter for general psychiatric examination, requested by authority: Secondary | ICD-10-CM | POA: Insufficient documentation

## 2017-08-17 DIAGNOSIS — Z79899 Other long term (current) drug therapy: Secondary | ICD-10-CM | POA: Diagnosis not present

## 2017-08-17 DIAGNOSIS — F332 Major depressive disorder, recurrent severe without psychotic features: Secondary | ICD-10-CM | POA: Diagnosis not present

## 2017-08-17 DIAGNOSIS — T391X2A Poisoning by 4-Aminophenol derivatives, intentional self-harm, initial encounter: Secondary | ICD-10-CM | POA: Diagnosis not present

## 2017-08-17 DIAGNOSIS — F329 Major depressive disorder, single episode, unspecified: Secondary | ICD-10-CM | POA: Diagnosis present

## 2017-08-17 DIAGNOSIS — R45851 Suicidal ideations: Secondary | ICD-10-CM | POA: Diagnosis not present

## 2017-08-17 DIAGNOSIS — T1491XA Suicide attempt, initial encounter: Secondary | ICD-10-CM | POA: Diagnosis not present

## 2017-08-17 LAB — COMPREHENSIVE METABOLIC PANEL
ALK PHOS: 47 U/L (ref 38–126)
ALT: 8 U/L — ABNORMAL LOW (ref 14–54)
AST: 15 U/L (ref 15–41)
Albumin: 4.4 g/dL (ref 3.5–5.0)
Anion gap: 7 (ref 5–15)
BILIRUBIN TOTAL: 0.7 mg/dL (ref 0.3–1.2)
BUN: 11 mg/dL (ref 6–20)
CALCIUM: 9.4 mg/dL (ref 8.9–10.3)
CO2: 26 mmol/L (ref 22–32)
Chloride: 106 mmol/L (ref 101–111)
Creatinine, Ser: 0.7 mg/dL (ref 0.44–1.00)
GFR calc non Af Amer: >60 mL/min (ref >60)
Glucose, Bld: 94 mg/dL (ref 65–99)
Potassium: 4.2 mmol/L (ref 3.5–5.1)
Sodium: 139 mmol/L (ref 135–145)
TOTAL PROTEIN: 7 g/dL (ref 6.5–8.1)

## 2017-08-17 LAB — URINALYSIS, ROUTINE W REFLEX MICROSCOPIC
BILIRUBIN URINE: NEGATIVE
GLUCOSE, UA: NEGATIVE mg/dL
Hgb urine dipstick: NEGATIVE
KETONES UR: NEGATIVE mg/dL
Leukocytes, UA: NEGATIVE
Nitrite: NEGATIVE
PROTEIN: NEGATIVE mg/dL
Specific Gravity, Urine: 1.031 — ABNORMAL HIGH (ref 1.005–1.030)
pH: 5 (ref 5.0–8.0)

## 2017-08-17 LAB — CBC WITH DIFFERENTIAL/PLATELET
BASOS ABS: 0.1 10*3/uL (ref 0.0–0.1)
BASOS PCT: 1 %
Eosinophils Absolute: 0.2 10*3/uL (ref 0.0–0.7)
Eosinophils Relative: 4 %
HCT: 37 % (ref 36.0–46.0)
Hemoglobin: 12.1 g/dL (ref 12.0–15.0)
Lymphocytes Relative: 36 %
Lymphs Abs: 2.3 10*3/uL (ref 0.7–4.0)
MCH: 28.1 pg (ref 26.0–34.0)
MCHC: 32.7 g/dL (ref 30.0–36.0)
MCV: 85.8 fL (ref 78.0–100.0)
MONO ABS: 0.4 10*3/uL (ref 0.1–1.0)
Monocytes Relative: 7 %
Neutro Abs: 3.3 10*3/uL (ref 1.7–7.7)
Neutrophils Relative %: 52 %
PLATELETS: 280 10*3/uL (ref 150–400)
RBC: 4.31 MIL/uL (ref 3.87–5.11)
RDW: 12.7 % (ref 11.5–15.5)
WBC: 6.3 10*3/uL (ref 4.0–10.5)

## 2017-08-17 LAB — RAPID URINE DRUG SCREEN, HOSP PERFORMED
Amphetamines: NOT DETECTED
BARBITURATES: NOT DETECTED
Benzodiazepines: NOT DETECTED
COCAINE: NOT DETECTED
Opiates: NOT DETECTED
Tetrahydrocannabinol: NOT DETECTED

## 2017-08-17 LAB — SALICYLATE LEVEL: Salicylate Lvl: <7 mg/dL (ref 2.8–30.0)

## 2017-08-17 LAB — PREGNANCY, URINE: Preg Test, Ur: NEGATIVE

## 2017-08-17 LAB — ACETAMINOPHEN LEVEL

## 2017-08-17 LAB — ETHANOL: Alcohol, Ethyl (B): <10 mg/dL (ref <10)

## 2017-08-17 MED ORDER — CLONAZEPAM 0.5 MG PO TABS: 0.5000 mg | ORAL_TABLET | Freq: Two times a day (BID) | ORAL | Status: DC | PRN
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
## 2017-08-17 NOTE — ED Notes (Signed)
Report given to RN

## 2017-08-17 NOTE — ED Triage Notes (Signed)
Patient was reported missing yesterday and was contacting ex boyfriend and having thoughts of harming herself by ingesting pills. Patient was found this am [facility]

## 2017-08-17 NOTE — ED Provider Notes (Signed)
[facility]-EMERGENCY DEPT Provider Note   CSN: [id number] Arrival date & time: 08/17/17  1315     History   Chief Complaint No chief complaint on file.   HPI Cassidy Livingston is a 22 y.o. female.  HPI Patient presents  under IVC.  Had been missing for the last 2 days.  She is a Consulting civil engineer at [employer].  Reportedly found and had been making threatening text to an ex-boyfriend.  Had told [employer] that she had taken 8 ibuprofen.  Told [employer] police that she did not take anything and told me she did not take anything.  Admits to some suicidal thoughts.  Has a history of depression.  Denies substance abuse.  Denies possibility of pregnancy. Past Medical History:  Diagnosis Date  . ADHD, predominantly inattentive type   . Anxiety   . Depression     Patient Active Problem List   Diagnosis Date Noted  . ADHD, predominantly inattentive type 04/02/2017  . Anxiety 11/07/2013  . Depression 11/07/2013    Past Surgical History:  Procedure Laterality Date  . NO PAST SURGERIES      OB History    Gravida Para Term Preterm AB Living   0 0 0 0 0     SAB TAB Ectopic Multiple Live Births   0 0 0           Home Medications    Prior to Admission medications   Medication Sig Start Date End Date Taking? Authorizing Provider  acetaminophen (TYLENOL) 500 MG tablet Take 1,000 mg by mouth every 6 (six) hours as needed for moderate pain.   Yes [provider]  lamoTRIgine (LAMICTAL) 25 MG tablet Take 25 mg by mouth. 25 mg at bedtime for one week, then take 25 twice daily thereafter 07/13/17  Yes [provider]  Melatonin 5 MG TABS Take 5 mg by mouth once.    Yes [provider]  buPROPion (WELLBUTRIN XL) 150 MG 24 hr tablet Take 1 tablet (150 mg total) by mouth daily. Patient not taking: Reported on 08/17/2017 04/30/17   Porfirio Oar, PA-C  clonazePAM (KLONOPIN) 0.5 MG tablet Take 1 tablet (0.5 mg total) by mouth 2 (two) times daily as needed for  anxiety. Patient not taking: Reported on 08/17/2017 04/30/17   Porfirio Oar, PA-C  etonogestrel-ethinyl estradiol (NUVARING) 0.12-0.015 MG/24HR vaginal ring Place 1 each vaginally every 28 (twenty-eight) days. Patient not taking: Reported on 08/17/2017 04/02/17   Porfirio Oar, PA-C  lisdexamfetamine (VYVANSE) 30 MG capsule Take 1 capsule (30 mg total) by mouth daily. Patient not taking: Reported on 08/17/2017 04/30/17   Porfirio Oar, PA-C  Escitalopram Oxalate (LEXAPRO PO) Take by mouth.  04/25/13  [provider]    Family History Family History  Problem Relation Age of Onset  . Diabetes Mother   . Heart disease Maternal Grandmother     Social History Social History   Tobacco Use  . Smoking status: Never Smoker  . Smokeless tobacco: Never Used  Substance Use Topics  . Alcohol use: Yes    Alcohol/week: 0.0 - 2.4 oz    Comment: Varied; BAC not available  . Drug use: No     Allergies   Patient has no known allergies.   Review of Systems Review of Systems  Constitutional: Negative for appetite change and fatigue.  HENT: Negative for congestion and dental problem.   Eyes: Negative for visual disturbance.  Respiratory: Negative for shortness of breath.   Cardiovascular: Negative for  chest pain.  Gastrointestinal: Negative for abdominal pain.  Genitourinary: Negative for flank pain.  Musculoskeletal: Negative for arthralgias.  Skin: Negative for pallor.  Neurological: Negative for seizures.  Psychiatric/Behavioral: Positive for dysphoric mood and suicidal ideas. Negative for hallucinations.     Physical Exam Updated Vital Signs Ht 5\' 2"  (1.575 m)   Wt 52.2 kg (115 lb)   BMI 21.03 kg/m   Physical Exam  Constitutional: She appears well-developed.  HENT:  Head: Normocephalic.  Eyes: EOM are normal.  Neck: Neck supple.  Cardiovascular: Normal rate.  Pulmonary/Chest: Effort normal.  Abdominal: Soft. There is no tenderness.  Musculoskeletal: She  exhibits no edema.  Neurological: She is alert.  Skin: Skin is warm.  Psychiatric:  Patient with a somewhat sad affect     ED Treatments / Results  Labs (all labs ordered are listed, but only abnormal results are displayed) Labs Reviewed  COMPREHENSIVE METABOLIC PANEL - Abnormal; Notable for the following components:      Result Value   ALT 8 (*)    All other components within normal limits  ACETAMINOPHEN LEVEL - Abnormal; Notable for the following components:   Acetaminophen (Tylenol), Serum <10 (*)    All other components within normal limits  URINALYSIS, ROUTINE W REFLEX MICROSCOPIC - Abnormal; Notable for the following components:   Color, Urine AMBER (*)    APPearance CLOUDY (*)    Specific Gravity, Urine 1.031 (*)    All other components within normal limits  ETHANOL  SALICYLATE LEVEL  CBC WITH DIFFERENTIAL/PLATELET  PREGNANCY, URINE  RAPID URINE DRUG SCREEN, HOSP PERFORMED    EKG  EKG Interpretation None       Radiology No results found.  Procedures Procedures (including critical care time)  Medications Ordered in ED Medications - No data to display   Initial Impression / Assessment and Plan / ED Course  I have reviewed the triage vital signs and the nursing notes.  Pertinent labs & imaging results that were available during my care of the patient were reviewed by me and considered in my medical decision making (see chart for details).     Patient brought in under IVC.  Reportedly had been making suicidal statements including questionable statements of having overdosed.  At this point she appears to be medically cleared.  To be seen by TTS.  Final Clinical Impressions(s) / ED Diagnoses   Final diagnoses:  Depression, unspecified depression type  Suicidal ideations    ED Discharge Orders    None       Benjiman CorePickering, Philana Younis, MD 08/17/17 1554

## 2017-08-18 ENCOUNTER — Other Ambulatory Visit: Payer: Self-pay

## 2017-08-18 ENCOUNTER — Encounter (HOSPITAL_COMMUNITY): Payer: Self-pay | Admitting: *Deleted

## 2017-08-18 ENCOUNTER — Inpatient Hospital Stay (HOSPITAL_COMMUNITY)
Admission: AD | Admit: 2017-08-18 | Discharge: 2017-08-23 | DRG: 885 | Disposition: A | Discharge status: Home / Self Care | Payer: 59 | Source: Intra-hospital | Attending: Psychiatry | Admitting: Psychiatry

## 2017-08-18 DIAGNOSIS — G4710 Hypersomnia, unspecified: Secondary | ICD-10-CM | POA: Diagnosis present

## 2017-08-18 DIAGNOSIS — R450 Nervousness: Secondary | ICD-10-CM | POA: Diagnosis not present

## 2017-08-18 DIAGNOSIS — Z79899 Other long term (current) drug therapy: Secondary | ICD-10-CM | POA: Diagnosis not present

## 2017-08-18 DIAGNOSIS — F900 Attention-deficit hyperactivity disorder, predominantly inattentive type: Secondary | ICD-10-CM | POA: Diagnosis present

## 2017-08-18 DIAGNOSIS — F332 Major depressive disorder, recurrent severe without psychotic features: Secondary | ICD-10-CM | POA: Diagnosis present

## 2017-08-18 DIAGNOSIS — T1491XA Suicide attempt, initial encounter: Secondary | ICD-10-CM | POA: Diagnosis not present

## 2017-08-18 DIAGNOSIS — Z833 Family history of diabetes mellitus: Secondary | ICD-10-CM

## 2017-08-18 DIAGNOSIS — F419 Anxiety disorder, unspecified: Secondary | ICD-10-CM | POA: Diagnosis present

## 2017-08-18 DIAGNOSIS — Z8249 Family history of ischemic heart disease and other diseases of the circulatory system: Secondary | ICD-10-CM | POA: Diagnosis not present

## 2017-08-18 DIAGNOSIS — T391X2A Poisoning by 4-Aminophenol derivatives, intentional self-harm, initial encounter: Secondary | ICD-10-CM | POA: Diagnosis not present

## 2017-08-18 DIAGNOSIS — Z811 Family history of alcohol abuse and dependence: Secondary | ICD-10-CM | POA: Diagnosis not present

## 2017-08-18 DIAGNOSIS — Z560 Unemployment, unspecified: Secondary | ICD-10-CM | POA: Diagnosis not present

## 2017-08-18 DIAGNOSIS — Z915 Personal history of self-harm: Secondary | ICD-10-CM

## 2017-08-18 DIAGNOSIS — R946 Abnormal results of thyroid function studies: Secondary | ICD-10-CM | POA: Diagnosis present

## 2017-08-18 DIAGNOSIS — Z818 Family history of other mental and behavioral disorders: Secondary | ICD-10-CM

## 2017-08-18 DIAGNOSIS — R51 Headache: Secondary | ICD-10-CM | POA: Diagnosis not present

## 2017-08-18 DIAGNOSIS — R45851 Suicidal ideations: Secondary | ICD-10-CM | POA: Diagnosis present

## 2017-08-18 DIAGNOSIS — G4700 Insomnia, unspecified: Secondary | ICD-10-CM | POA: Diagnosis present

## 2017-08-18 HISTORY — DX: Headache: R51

## 2017-08-18 HISTORY — DX: Headache, unspecified: R51.9

## 2017-08-18 MED ORDER — ALUM & MAG HYDROXIDE-SIMETH 200-200-20 MG/5ML PO SUSP: 30.0000 mL | ORAL | Status: DC | PRN

## 2017-08-18 MED ORDER — MAGNESIUM HYDROXIDE 400 MG/5ML PO SUSP: 30.0000 mL | Freq: Every day | ORAL | Status: DC | PRN

## 2017-08-18 MED ORDER — TRAZODONE HCL 50 MG PO TABS
50.0000 mg | ORAL_TABLET | Freq: Every evening | ORAL | Status: DC | PRN
  Administered 2017-08-18 – 2017-08-22 (×5): 50 mg via ORAL
  Filled 2017-08-18 (×14): qty 1

## 2017-08-18 MED ORDER — ACETAMINOPHEN 325 MG PO TABS: 650.0000 mg | ORAL_TABLET | Freq: Four times a day (QID) | ORAL | Status: DC | PRN

## 2017-08-18 MED ORDER — ETONOGESTREL-ETHINYL ESTRADIOL 0.12-0.015 MG/24HR VA RING: 1.0000 | VAGINAL_RING | VAGINAL | Status: DC

## 2017-08-18 NOTE — Progress Notes (Signed)
Cassidy Livingston is a 22 year old female pt admitted on involuntary basis. On admission, she does endorse that she took overdose and spoke about it being related to her ex-boyfriend. She currently denies any SI and is able to contract for safety while in the hospital. She reports that she does not use any drugs or alcohol. She reports that she was taking medications up until 3 weeks ago when she took herself off them and reports the medicine was making her feel more suicidal. She reports being hospitalized once before about 6 [facility]. She reports that she is in school currently but reports that she is thinking about dropping out. She reports that she was living with 3 roommates but reports that she will probably live with her parents when she gets discharged. She also shared [place] and [place] that she [place] living [place] and was working at [employer] and reports that she had a better support system there then she does here. She was oriented to the unit and safety maintained.

## 2017-08-18 NOTE — ED Notes (Signed)
On admission to the Acute Unit pt is calm and cooperative. She was given a ham sandwich because she does not like tuna.

## 2017-08-18 NOTE — Tx Team (Signed)
Initial Treatment Plan 08/18/2017 4:27 PM Cassidy Moosnastasia A Kronenberger RUE:[id number]    PATIENT STRESSORS: Educational concerns Marital or family conflict Medication change or noncompliance   PATIENT STRENGTHS: Ability for insight Average or above average intelligence Capable of independent living General fund of knowledge Motivation for treatment/growth   PATIENT IDENTIFIED PROBLEMS: Depression Suicidal thoughts "Learn how to control my emotions" "Get rid of obsessive thoughts"                     DISCHARGE CRITERIA:  Ability to meet basic life and health needs Improved stabilization in mood, thinking, and/or behavior Reduction of life-threatening or endangering symptoms to within safe limits Verbal commitment to aftercare and medication compliance  PRELIMINARY DISCHARGE PLAN: Attend aftercare/continuing care group  PATIENT/FAMILY INVOLVEMENT: This treatment plan has been presented to and reviewed with the patient, Cassidy Livingston, and/or family member, .  The patient and family have been given the opportunity to ask questions and make suggestions.  Kenyetta Fife, [place], [place] 08/18/2017, 4:27 PM

## 2017-08-18 NOTE — ED Notes (Signed)
Report given to [facility] RN 

## 2017-08-18 NOTE — Progress Notes (Signed)
Psychoeducational Group Note  Date:  08/18/2017 Time:  1600  Group Topic/Focus:  Identifying Needs:   The focus of this group is to help patients identify their personal needs that have been historically problematic and identify healthy behaviors to address their needs. Making Healthy Choices:   The focus of this group is to help patients identify negative/unhealthy choices they were using prior to admission and identify positive/healthier coping strategies to replace them upon discharge.  Participation Level: Did Not Attend  Participation Quality:  Not Applicable  Affect:  Not Applicable  Cognitive:  Not Applicable  Insight:  Not Applicable  Engagement in Group: Not Applicable  Additional Comments:    Maretta Losli, [place] Gutridge [name] 08/18/2017, 5:23 PM

## 2017-08-18 NOTE — ED Notes (Signed)
Pt discharged safely with [employer].  All belongings were sent with pt.  Pt was calm and cooperative at discharge.

## 2017-08-18 NOTE — Progress Notes (Signed)
Writer introduced self to pt. No concerns verbalized by pt. Pt requested towels and washcloths. Writer provided pt with items at her request.

## 2017-08-18 NOTE — Progress Notes (Signed)
Adult Psychoeducational Group Note  Date:  08/18/2017 Time:  10:18 PM  Group Topic/Focus:  Wrap-Up Group:   The focus of this group is to help patients review their daily goal of treatment and discuss progress on daily workbooks.  Participation Level:  Active  Participation Quality:  Appropriate  Affect:  Appropriate  Cognitive:  Alert  Insight: Appropriate  Engagement in Group:  Engaged  Modes of Intervention:  Discussion  Additional Comments:  Patient stated that her day was okay. Patient's goal to work on while she's here is to build her self esteem.  Cailynn Bodnar L Humaira Sculley 08/18/2017, 10:18 PM

## 2017-08-18 NOTE — Consult Note (Addendum)
[facility] Face-to-Face Psychiatry Consult   Reason for Consult:  Suicide attempt Referring Physician:  EDP Patient Identification: Cassidy Livingston MRN:  [id number] Principal Diagnosis: Major depressive disorder, recurrent severe without psychotic features [facility]) Diagnosis:   Patient Active Problem List   Diagnosis Date Noted  . Major depressive disorder, recurrent severe without psychotic features ([place]) [F33.2] 08/18/2017    Priority: High  . ADHD, predominantly inattentive type [F90.0] 04/02/2017  . Anxiety [F41.9] 11/07/2013    Total Time spent with patient: 1 hour  Subjective:   Cassidy Livingston is a 22 y.o. female patient admitted with suicide attempt.  HPI:  22 yo female who presented to the ED after an intentional overdose on 5-6 acetaminophen.  Medically stable at this time.  No homicidal ideations, hallucinations, or substance abuse.  She reports she was upset with her boyfriend and wanted to kill herself.  She seems more interested in making sure she can have visitors across the street than her treatment.  Her boyfriend came to the ED and was attentive.  Cluster B traits are prevalent, DBT therapy outpatient.  Past Psychiatric History: Cluster B traits, depression  Risk to Self: Suicidal Ideation: No-Not Currently/Within Last 6 Months Suicidal Intent: No-Not Currently/Within Last 6 Months Is patient at risk for suicide?: No Suicidal Plan?: No-Not Currently/Within Last 6 Months Access to Means: Yes Specify Access to Suicidal Means: acetaminophen What has been your use of drugs/alcohol within the last 12 months?: pt denies How many times?: 4 Triggers for Past Attempts: Unpredictable Intentional Self Injurious Behavior: None Risk to Others: Homicidal Ideation: No Thoughts of Harm to Others: No Current Homicidal Intent: No Current Homicidal Plan: No Access to Homicidal Means: No History of harm to others?: No Assessment of Violence: None Noted Does patient have access to  weapons?: No Criminal Charges Pending?: No Does patient have a court date: No Prior Inpatient Therapy: Prior Inpatient Therapy: Yes Prior Therapy Dates: 6 years ago Reason for Treatment: depression Prior Outpatient Therapy: Prior Outpatient Therapy: No Does patient have an ACCT team?: No Does patient have Intensive In-House Services?  : No Does patient have [facility] services? : No Does patient have P4CC services?: No  Past Medical History:  Past Medical History:  Diagnosis Date  . ADHD, predominantly inattentive type   . Anxiety   . Depression     Past Surgical History:  Procedure Laterality Date  . NO PAST SURGERIES     Family History:  Family History  Problem Relation Age of Onset  . Diabetes Mother   . Heart disease Maternal Grandmother    Family Psychiatric  History: none Social History:  Social History   Substance and Sexual Activity  Alcohol Use Yes  . Alcohol/week: 0.0 - 2.4 oz   Comment: Varied; BAC not available     Social History   Substance and Sexual Activity  Drug Use No    Social History   Socioeconomic History  . Marital status: Single    Spouse name: n/a  . Number of children: 0  . Years of education: None  . Highest education level: None  Social Needs  . Financial resource strain: None  . Food insecurity - worry: None  . Food insecurity - inability: None  . Transportation needs - medical: None  . Transportation needs - non-medical: None  Occupational History  . Occupation: student    Comment: [employer], anticipated graduation 05/2018  Tobacco Use  . Smoking status: Never Smoker  . Smokeless tobacco: Never Used  Substance and Sexual Activity  . Alcohol use: Yes    Alcohol/week: 0.0 - 2.4 oz    Comment: Varied; BAC not available  . Drug use: No  . Sexual activity: Yes    Partners: Male    Birth control/protection: None  Other Topics Concern  . None  Social History Narrative   Grew up in [place], [place].   Her family  lives in [place], [place].   Lives off campus with roommates.       Additional Social History: N/A     Allergies:  No Known Allergies  Labs:  Results for orders placed or performed during the hospital encounter of 08/17/17 (from the past 48 hour(s))  Comprehensive metabolic panel     Status: Abnormal   Collection Time: 08/17/17  1:35 PM  Result Value Ref Range   Sodium 139 135 - 145 mmol/L   Potassium 4.2 3.5 - 5.1 mmol/L   Chloride 106 101 - 111 mmol/L   CO2 26 22 - 32 mmol/L   Glucose, Bld 94 65 - 99 mg/dL   BUN 11 6 - 20 mg/dL   Creatinine, Ser 0.70 0.44 - 1.00 mg/dL   Calcium 9.4 8.9 - 10.3 mg/dL   Total Protein 7.0 6.5 - 8.1 g/dL   Albumin 4.4 3.5 - 5.0 g/dL   AST 15 15 - 41 U/L   ALT 8 (L) 14 - 54 U/L   Alkaline Phosphatase 47 38 - 126 U/L   Total Bilirubin 0.7 0.3 - 1.2 mg/dL   GFR calc non Af Amer >60 >60 mL/min   GFR calc Af Amer >60 >60 mL/min    Comment: (NOTE) The eGFR has been calculated using the CKD EPI equation. This calculation has not been validated in all clinical situations. eGFR's persistently <60 mL/min signify possible Chronic Kidney Disease.    Anion gap 7 5 - 15    Comment: Performed at [facility], [place] [address] [place], [place] [address]  Ethanol     Status: None   Collection Time: 08/17/17  1:35 PM  Result Value Ref Range   Alcohol, Ethyl (B) <10 <10 mg/dL    Comment:        LOWEST DETECTABLE LIMIT FOR SERUM ALCOHOL IS 10 mg/dL FOR MEDICAL PURPOSES ONLY Performed at [place] [address], [place], [place] [address]   Acetaminophen level     Status: Abnormal   Collection Time: 08/17/17  1:35 PM  Result Value Ref Range   Acetaminophen (Tylenol), Serum <10 (L) 10 - 30 ug/mL    Comment:        THERAPEUTIC CONCENTRATIONS VARY SIGNIFICANTLY. A RANGE OF 10-30 ug/mL MAY BE AN EFFECTIVE CONCENTRATION FOR MANY PATIENTS. HOWEVER, SOME ARE BEST TREATED AT CONCENTRATIONS OUTSIDE  THIS RANGE. ACETAMINOPHEN CONCENTRATIONS >150 ug/mL AT 4 HOURS AFTER INGESTION AND >50 ug/mL AT 12 HOURS AFTER INGESTION ARE OFTEN ASSOCIATED WITH TOXIC REACTIONS. Performed at [facility], [place] [address] [place], [place] [address]   Salicylate level     Status: None   Collection Time: 08/17/17  1:35 PM  Result Value Ref Range   Salicylate Lvl <6.5 2.8 - 30.0 mg/dL    Comment: Performed at [facility], [place] [address] [place],  [address]  CBC with Differential     Status: None   Collection Time: 08/17/17  1:35 PM  Result Value Ref Range   WBC 6.3 4.0 - 10.5 K/uL   RBC 4.31 3.87 -  5.11 MIL/uL   Hemoglobin 12.1 12.0 - 15.0 g/dL   HCT 37.0 36.0 - 46.0 %   MCV 85.8 78.0 - 100.0 fL   MCH 28.1 26.0 - 34.0 pg   MCHC 32.7 30.0 - 36.0 g/dL   RDW 12.7 11.5 - 15.5 %   Platelets 280 150 - 400 K/uL   Neutrophils Relative % 52 %   Neutro Abs 3.3 1.7 - 7.7 K/uL   Lymphocytes Relative 36 %   Lymphs Abs 2.3 0.7 - 4.0 K/uL   Monocytes Relative 7 %   Monocytes Absolute 0.4 0.1 - 1.0 K/uL   Eosinophils Relative 4 %   Eosinophils Absolute 0.2 0.0 - 0.7 K/uL   Basophils Relative 1 %   Basophils Absolute 0.1 0.0 - 0.1 K/uL    Comment: Performed at [facility], [place] [address], [place], [place] [address]  Urinalysis, Routine w reflex microscopic     Status: Abnormal   Collection Time: 08/17/17  1:40 PM  Result Value Ref Range   Color, Urine AMBER (A) YELLOW    Comment: BIOCHEMICALS MAY BE AFFECTED BY COLOR   APPearance CLOUDY (A) CLEAR   Specific Gravity, Urine 1.031 (H) 1.005 - 1.030   pH 5.0 5.0 - 8.0   Glucose, UA NEGATIVE NEGATIVE mg/dL   Hgb urine dipstick NEGATIVE NEGATIVE   Bilirubin Urine NEGATIVE NEGATIVE   Ketones, ur NEGATIVE NEGATIVE mg/dL   Protein, ur NEGATIVE NEGATIVE mg/dL   Nitrite NEGATIVE NEGATIVE   Leukocytes, UA NEGATIVE NEGATIVE    Comment: Performed at [facility], [place]  [address] [place], [place] [address]  Pregnancy, urine     Status: None   Collection Time: 08/17/17  1:40 PM  Result Value Ref Range   Preg Test, Ur NEGATIVE NEGATIVE    Comment:        THE SENSITIVITY OF THIS METHODOLOGY IS >20 mIU/mL. Performed at [facility], [place] [address], [place], [place] [address]   Urine rapid drug screen (hosp performed)     Status: None   Collection Time: 08/17/17  1:40 PM  Result Value Ref Range   Opiates NONE DETECTED NONE DETECTED   Cocaine NONE DETECTED NONE DETECTED   Benzodiazepines NONE DETECTED NONE DETECTED   Amphetamines NONE DETECTED NONE DETECTED   Tetrahydrocannabinol NONE DETECTED NONE DETECTED   Barbiturates NONE DETECTED NONE DETECTED    Comment: (NOTE) DRUG SCREEN FOR MEDICAL PURPOSES ONLY.  IF CONFIRMATION IS NEEDED FOR ANY PURPOSE, NOTIFY LAB WITHIN 5 DAYS. LOWEST DETECTABLE LIMITS FOR URINE DRUG SCREEN Drug Class                     Cutoff (ng/mL) Amphetamine and metabolites    1000 Barbiturate and metabolites    200 Benzodiazepine                 093 Tricyclics and metabolites     300 Opiates and metabolites        300 Cocaine and metabolites        300 THC                            50 Performed at [facility], [place] [address], [place], [place] [address]     No current facility-administered medications for this encounter.    Current Outpatient Medications  Medication Sig Dispense Refill  . acetaminophen (TYLENOL) 500 MG tablet Take 1,000 mg  by mouth every 6 (six) hours as needed for moderate pain.    Marland Kitchen lamoTRIgine (LAMICTAL) 25 MG tablet Take 25 mg by mouth. 25 mg at bedtime for one week, then take 25 twice daily thereafter  1  . Melatonin 5 MG TABS Take 5 mg by mouth once.     Marland Kitchen buPROPion (WELLBUTRIN XL) 150 MG 24 hr tablet Take 1 tablet (150 mg total) by mouth daily. (Patient not taking: Reported on 08/17/2017) 90 tablet 0  . clonazePAM (KLONOPIN) 0.5 MG tablet Take 1 tablet (0.5 mg  total) by mouth 2 (two) times daily as needed for anxiety. (Patient not taking: Reported on 08/17/2017) 30 tablet 0  . etonogestrel-ethinyl estradiol (NUVARING) 0.12-0.015 MG/24HR vaginal ring Place 1 each vaginally every 28 (twenty-eight) days. (Patient not taking: Reported on 08/17/2017) 1 each 12  . lisdexamfetamine (VYVANSE) 30 MG capsule Take 1 capsule (30 mg total) by mouth daily. (Patient not taking: Reported on 08/17/2017) 30 capsule 0    Musculoskeletal: Strength & Muscle Tone: within normal limits Gait & Station: normal Patient leans: N/A  Psychiatric Specialty Exam: Physical Exam  Constitutional: She is oriented to person, place, and time. She appears well-developed and well-nourished.  HENT:  Head: Normocephalic.  Neck: Normal range of motion.  Respiratory: Effort normal.  Musculoskeletal: Normal range of motion.  Neurological: She is alert and oriented to person, place, and time.  Psychiatric: Her speech is normal and behavior is normal. Cognition and memory are normal. She expresses impulsivity. She exhibits a depressed mood. She expresses suicidal ideation. She expresses suicidal plans.    Review of Systems  Psychiatric/Behavioral: Positive for depression and suicidal ideas.  All other systems reviewed and are negative.   Blood pressure 105/62, pulse 68, temperature 97.7 F (36.5 C), temperature source Oral, resp. rate 16, height 5' 2"  (1.575 m), weight 52.2 kg (115 lb), SpO2 100 %.Body mass index is 21.03 kg/m.  General Appearance: Casual  Eye Contact:  Fair  Speech:  Normal Rate  Volume:  Normal  Mood:  Depressed  Affect:  Congruent  Thought Process:  Coherent and Descriptions of Associations: Intact  Orientation:  Full (Time, Place, and Person)  Thought Content:  WDL and Logical  Suicidal Thoughts:  No currently but recent suicide attempt.   Homicidal Thoughts:  No  Memory:  Immediate;   Fair Recent;   Fair Remote;   Fair  Judgement:  Poor  Insight:  Fair   Psychomotor Activity:  Decreased  Concentration:  Concentration: Fair and Attention Span: Fair  Recall:  [employer] of Knowledge:  Fair  Language:  Good  Akathisia:  No  Handed:  Right  AIMS (if indicated):   N/A  Assets:  Leisure Time Physical Health Resilience Social Support  ADL's:  Intact  Cognition:  WNL  Sleep:   N/A     Treatment Plan Summary: Daily contact with patient to assess and evaluate symptoms and progress in treatment, Medication management and Plan major depressive disorder, recurrent, severe without psychosis:  -Crisis stabilization -Medication management:  None started due to her overdose. -Individual counseling  Disposition: Recommend psychiatric Inpatient admission when medically cleared.  Waylan Boga, NP 08/18/2017 1:31 PM   Patient seen face-to-face for psychiatric evaluation, chart reviewed and case discussed with the physician extender and developed treatment plan. Reviewed the information documented and agree with the treatment plan.  Buford Dresser, DO 08/18/17 5:36 PM

## 2017-08-19 DIAGNOSIS — F332 Major depressive disorder, recurrent severe without psychotic features: Principal | ICD-10-CM

## 2017-08-19 DIAGNOSIS — Z818 Family history of other mental and behavioral disorders: Secondary | ICD-10-CM

## 2017-08-19 DIAGNOSIS — Z915 Personal history of self-harm: Secondary | ICD-10-CM

## 2017-08-19 DIAGNOSIS — T391X2A Poisoning by 4-Aminophenol derivatives, intentional self-harm, initial encounter: Secondary | ICD-10-CM

## 2017-08-19 DIAGNOSIS — Z560 Unemployment, unspecified: Secondary | ICD-10-CM

## 2017-08-19 DIAGNOSIS — Z811 Family history of alcohol abuse and dependence: Secondary | ICD-10-CM

## 2017-08-19 DIAGNOSIS — R51 Headache: Secondary | ICD-10-CM

## 2017-08-19 DIAGNOSIS — T1491XA Suicide attempt, initial encounter: Secondary | ICD-10-CM

## 2017-08-19 MED ORDER — CITALOPRAM HYDROBROMIDE 10 MG PO TABS
10.0000 mg | ORAL_TABLET | Freq: Every day | ORAL | Status: DC
  Administered 2017-08-19 – 2017-08-23 (×5): 10 mg via ORAL
  Filled 2017-08-19 (×7): qty 1

## 2017-08-19 MED ORDER — HYDROXYZINE HCL 25 MG PO TABS
25.0000 mg | ORAL_TABLET | Freq: Four times a day (QID) | ORAL | Status: DC | PRN
  Administered 2017-08-19 – 2017-08-22 (×3): 25 mg via ORAL
  Filled 2017-08-19 (×4): qty 1

## 2017-08-19 NOTE — Progress Notes (Signed)
Patient ID: Cassidy MoosAnastasia A Livingston, female   DOB: 27-Apr-1996, 22 y.o.   MRN: [id number]  Pt currently presents with a masked affect and cooeprative behavior. Pt reports to Clinical research associatewriter that their goal is to "stay 3 or 4 more days, go back to living with my parents and see my counselor." Pt reports no concerns, remains guarded. Limited interaction with other patients. Continues to be resistant to care. Pt reports improved sleep with current medication regimen.   Pt provided with medications per providers orders. Pt's labs and vitals were monitored throughout the night. Pt given a 1:1 about emotional and mental status. Pt supported and encouraged to express concerns and questions. Pt educated on medications and assertiveness techniques.  Pt's safety ensured with 15 minute and environmental checks. Pt currently denies SI/HI and A/V hallucinations. Pt verbally agrees to seek staff if SI/HI or A/VH occurs and to consult with staff before acting on any harmful thoughts. Will continue POC.

## 2017-08-19 NOTE — [facility] Suicide Risk Assessment (Signed)
[facility]   Nursing information obtained from:    chart and patient Demographic factors:   22 year old year single female, college student Current Mental Status:   see below Loss Factors:   relationship issues  Historical Factors:   history of depression, history of suicidal ideations Risk Reduction Factors:   resilience , physical health  Total Time spent with patient: 45 minutes Principal Problem:  MDD, No Psychotic Symptoms  Diagnosis:   Patient Active Problem List   Diagnosis Date Noted  . Major depressive disorder, recurrent severe without psychotic features (HCC) [F33.2] 08/18/2017  . ADHD, predominantly inattentive type [F90.0] 04/02/2017  . Anxiety [F41.9] 11/07/2013    Continued Clinical Symptoms:  Alcohol Use Disorder Identification Test Final Score (AUDIT): 3 The "Alcohol Use Disorders Identification Test", Guidelines for Use in Primary Care, Second Edition.  World Science writerHealth Organization [facility](WHO). Score between 0-7:  no or low risk or alcohol related problems. Score between 8-15:  moderate risk of alcohol related problems. Score between 16-19:  high risk of alcohol related problems. Score 20 or above:  warrants further diagnostic evaluation for alcohol dependence and treatment.   CLINICAL FACTORS:  22 year old single female, college student, history of depression, reports worsening depression with recent suicidal ideations. She states she checked into a motel and overdosed on Acetaminophen but promptly vomited afterwards.    Psychiatric Specialty Exam: Physical Exam  ROS  Blood pressure 113/77, pulse 86, temperature 97.8 F (36.6 C), temperature source Oral, resp. rate 18, height 5\' 1"  (1.549 m), weight 53.5 kg (118 lb).Body mass index is 22.3 kg/m.  See admit note MSE   COGNITIVE FEATURES THAT CONTRIBUTE TO RISK:  Closed-mindedness and Loss of executive function    SUICIDE RISK:   Moderate:  Frequent suicidal ideation with limited  intensity, and duration, some specificity in terms of plans, no associated intent, good self-control, limited dysphoria/symptomatology, some risk factors present, and identifiable protective factors, including available and accessible social support.  PLAN OF CARE: Patient will be admitted to inpatient psychiatric unit for stabilization and safety. Will provide and encourage milieu participation. Provide medication management and maked adjustments as needed.  Will follow daily.    I certify that inpatient services furnished can reasonably be expected to improve the patient's condition.   Craige CottaFernando A , MD 08/19/2017, 3:47 PM

## 2017-08-19 NOTE — Progress Notes (Signed)
Pt presents with a flat affect, brief eye contact and an anxious mood. Pt minimal during shift assessment and forwarded little information. Pt responses are vague. Pt stated that she's here in the hosp because of depression and loneliness. Pt then stated "I'm fine, will you let the doctor know that I'm doing good". Pt denies SI/HI. Pt visible on the milieu and attend groups.  Orders reviewed. Verbal support provided. Pt encouraged to attend groups. 15 minute checks performed for safety.

## 2017-08-19 NOTE — Progress Notes (Signed)
Adult Psychoeducational Group Note  Date:  08/19/2017 Time:  9:48 PM  Group Topic/Focus:  Wrap-Up Group:   The focus of this group is to help patients review their daily goal of treatment and discuss progress on daily workbooks.  Participation Level:  Active  Participation Quality:  Appropriate and Attentive  Affect:  Appropriate  Cognitive:  Alert, Appropriate and Oriented  Insight: Good  Engagement in Group:  Engaged  Modes of Intervention:  Discussion  Additional Comments:  Pt stated she had a good day and rated it a 8/10. Pt's goal was to come out of her room and socialize, which she did. Pt states she wants to work on the same thing tomorrow and not stay in her room, which she stated she has a habit of doing.  Leo GrosserMegan A Cayle Cordoba 08/19/2017, 9:48 PM

## 2017-08-19 NOTE — Progress Notes (Signed)
Adult Psychoeducational Group Note  Date:  08/19/2017 Time:  1000 Group Topic/Focus:  Crisis Planning:   The purpose of this group is to help patients create a crisis plan for use upon discharge or in the future, as needed.  Participation Level:  Minimal  Participation Quality:  Attentive  Affect:  Appropriate  Cognitive:  Appropriate  Insight: Good  Engagement in Group:  Engaged  Modes of Intervention:  Discussion, Education and Support  Additional Comments:    Cassidy Livingston, [name] [name] 08/19/2017, 4:23 PM

## 2017-08-19 NOTE — Progress Notes (Signed)
Adult Psychoeducational Group Note  Date:  08/19/2017 Time:  0900 Group Topic/Focus:  Goals Group:   The focus of this group is to help patients establish daily goals to achieve during treatment and discuss how the patient can incorporate goal setting into their daily lives to aide in recovery.  Participation Level:  Minimal  Participation Quality:  Attentive  Affect:  Appropriate  Cognitive:  Appropriate  Insight: Good and None  Engagement in Group:  Engaged  Modes of Intervention:  Discussion, Education and Support  Additional Comments:    Maretta Losli, Syrah Daughtrey [name] 08/19/2017, 4:59 PM

## 2017-08-19 NOTE — H&P (Signed)
Psychiatric Admission Assessment Adult  Patient Identification: Cassidy Livingston MRN:  [id number] Date of Evaluation:  08/19/2017 Chief Complaint:  Depression Principal Diagnosis: MDD, no psychotic features.  Diagnosis:   Patient Active Problem List   Diagnosis Date Noted  . Major depressive disorder, recurrent severe without psychotic features (HCC) [F33.2] 08/18/2017  . ADHD, predominantly inattentive type [F90.0] 04/02/2017  . Anxiety [F41.9] 11/07/2013   History of Present Illness: 22 year old female, who reports she has been feeling increasingly depressed over recent weeks to months. States " I feel like I am always a little depressed, but I feel it has been getting worse ". States she " disappeared " from her home and checked into a motel . This was triggered by an argument with her ex boyfriend. States " I had moments where I felt I just needed to take some time off and calm down, but at other moments I felt like overdosing ". States she actually took " a bunch of tylenols", but decided to vomit them. ( 2/19 acetaminophen level <10).  States her friends noted she was missing and her parents put out a missing person report, and states police found her at the hotel and she was brought to ED .  Associated Signs/Symptoms: Depression Symptoms:  depressed mood, anhedonia, hypersomnia, suicidal thoughts with specific plan, anxiety, loss of energy/fatigue, erratic appetite , states she has lost 10-15 lbs   Decreased sense of self esteem  (Hypo) Manic Symptoms:  Denies  Anxiety Symptoms:  Reports increased anxiety and generalized worry recently. Psychotic Symptoms: denies  PTSD Symptoms: Does not endorse  Total Time spent with patient: 45 minutes  Past Psychiatric History: [facility] age 22 [facility], suicidal ideations. Denies history of self cutting.  Reports history of suicide attempts by overdosing, most recently  11/18. Denies  history of psychosis. Denies history of PTSD. Denies history of mania or hypomania. Denies history of Eating Disorder   Is the patient at risk to self? Yes.    Has the patient been a risk to self in the past 6 months? Yes.    Has the patient been a risk to self within the distant past? No.  Is the patient a risk to others? No.  Has the patient been a risk to others in the past 6 months? No.  Has the patient been a risk to others within the distant past? No.   Prior Inpatient Therapy:  as above  Prior Outpatient Therapy:  [facility]  Alcohol Screening: 1. How often do you have a drink containing alcohol?: 2 to 4 times a month 2. How many drinks containing alcohol do you have on a typical day when you are drinking?: 1 or 2 3. How often do you have six or more drinks on one occasion?: Less than monthly AUDIT-C Score: 3 4. How often during the last year have you found that you were not able to stop drinking once you had started?: Never 5. How often during the last year have you failed to do what was normally expected from you becasue of drinking?: Never 6. How often during the last year have you needed a first drink in the morning to get yourself going after a heavy drinking session?: Never 7. How often during the last year have you had a feeling of guilt of remorse after drinking?: Never 8. How often during the last year have you  been unable to remember what happened the night before because you had been drinking?: Never 9. Have you or someone else been injured as a result of your drinking?: No 10. Has a relative or friend or a doctor or another health worker been concerned about your drinking or suggested you cut down?: No Alcohol Use Disorder Identification Test Final Score (AUDIT): 3 Intervention/Follow-up: AUDIT Score <7 follow-up not indicated Substance Abuse History in the last 12 months:  Denies alcohol abuse, denies cannabis or  other drug abuse . Consequences of Substance Abuse: Denies  Previous Psychotropic Medications: states she had stopped taking psychiatric medications three weeks ago, states " I am not sure of the name,but it was making me feel worse".   As per home med list she had been prescribed Wellbutrin XL ( states she did not like it, did not work well for her). States she has been off Wellbutrin XL since last year. She was also on Lamictal which she states she stopped a few weeks ago, and on Vyvanse , which she last took 11/18.  Psychological Evaluations:  No  Past Medical History:  Denies medical illnesses, NKDA.  Past Medical History:  Diagnosis Date  . ADHD, predominantly inattentive type   . Anxiety   . Depression   . Headache     Past Surgical History:  Procedure Laterality Date  . NO PAST SURGERIES     Family History: parents alive, live together, has one younger brother. Family History  Problem Relation Age of Onset  . Diabetes Mother   . Heart disease Maternal Grandmother    Family Psychiatric  History: states her father has history of depression and brother has history of  ADHD. No suicides in family. Paternal Aunt is alcoholic. 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  Currently not employed . Social History   Substance and Sexual Activity  Alcohol Use Yes  . Alcohol/week: 0.0 - 2.4 oz   Comment: Varied; BAC not available     Social History   Substance and Sexual Activity  Drug Use No    Additional Social History:  Allergies:  No Known Allergies Lab Results: No results found for this or any previous visit (from the past 48 hour(s)).  Blood Alcohol level:  Lab Results  Component Value Date   ETH <10 08/17/2017    Metabolic Disorder Labs:  No results found for: HGBA1C, MPG No results found for: PROLACTIN No results  found for: CHOL, TRIG, HDL, CHOLHDL, VLDL, LDLCALC  Current Medications: Current Facility-Administered Medications  Medication Dose Route Frequency Provider Last Rate Last Dose  . acetaminophen (TYLENOL) tablet 650 mg  650 mg Oral Q6H PRN Charm Rings, NP      . alum & mag hydroxide-simeth (MAALOX/MYLANTA) 200-200-20 MG/5ML suspension 30 mL  30 mL Oral Q4H PRN Charm Rings, NP      . magnesium hydroxide (MILK OF MAGNESIA) suspension 30 mL  30 mL Oral Daily PRN Charm Rings, NP      . traZODone (DESYREL) tablet 50 mg  50 mg Oral QHS,MR X 1 Kerry Hough, PA-C   50 mg at 08/18/17 2202   PTA Medications: Medications Prior to Admission  Medication Sig Dispense Refill Last Dose  . acetaminophen (TYLENOL) 500 MG tablet Take 1,000 mg by mouth every 6 (six) hours as needed for moderate  pain.   08/16/2017 at Unknown time  . buPROPion (WELLBUTRIN XL) 150 MG 24 hr tablet Take 1 tablet (150 mg total) by mouth daily. (Patient not taking: Reported on 08/17/2017) 90 tablet 0 Not Taking at Unknown time  . etonogestrel-ethinyl estradiol (NUVARING) 0.12-0.015 MG/24HR vaginal ring Place 1 each vaginally every 28 (twenty-eight) days. (Patient not taking: Reported on 08/17/2017) 1 each 12 Not Taking at Unknown time  . lamoTRIgine (LAMICTAL) 25 MG tablet Take 25 mg by mouth. 25 mg at bedtime for one week, then take 25 twice daily thereafter  1 Past Week at Unknown time  . lisdexamfetamine (VYVANSE) 30 MG capsule Take 1 capsule (30 mg total) by mouth daily. (Patient not taking: Reported on 08/17/2017) 30 capsule 0 Not Taking at Unknown time  . Melatonin 5 MG TABS Take 5 mg by mouth once.    08/16/2017 at Unknown time    Musculoskeletal: Strength & Muscle Tone: within normal limits Gait & Station: normal Patient leans: N/A  Psychiatric Specialty Exam: Physical Exam  Review of Systems  Constitutional: Negative.   HENT: Negative.   Eyes: Negative.   Respiratory: Negative.   Cardiovascular: Negative.    Gastrointestinal: Negative.        Denies RUQ, no choluria, denies acholia, no vomiting   Genitourinary: Negative.   Musculoskeletal: Negative.   Skin: Negative.   Neurological: Positive for headaches.  Endo/Heme/Allergies: Negative.   All other systems reviewed and are negative.   Blood pressure 113/77, pulse 86, temperature 97.8 F (36.6 C), temperature source Oral, resp. rate 18, height 5\' 1"  (1.549 m), weight 53.5 kg (118 lb).Body mass index is 22.3 kg/m.  General Appearance: Well Groomed  Eye Contact:  Fair  Speech:  Normal Rate  Volume:  Normal  Mood:  reports she is feeling better, less depressed. Describes mood as 6-7/10  Affect:  mildly constricted and anxious   Thought Process:  Linear and Descriptions of Associations: Intact  Orientation:  Full (Time, Place, and Person)  Thought Content:  no hallucinations, no delusions, not internally preoccupied   Suicidal Thoughts:  No denies any suicidal or self injurious ideations, denies homicidal ideations, contracts for safety on unit   Homicidal Thoughts:  No  Memory:  recent and remote grossly intact   Judgement:  Fair  Insight:  Fair  Psychomotor Activity:  Normal  Concentration:  Concentration: Good and Attention Span: Good  Recall:  Good  Fund of Knowledge:  Good  Language:  Good  Akathisia:  Negative  Handed:  Right  AIMS (if indicated):     Assets:  Communication Skills Desire for Improvement Resilience  ADL's:  Intact  Cognition:  WNL  Sleep:  Number of Hours: 6.75    Treatment Plan Summary: Daily contact with patient to assess and evaluate symptoms and progress in treatment, Medication management, Plan inpatient treatment  and medications as below   Observation Level/Precautions:  15 minute checks  Laboratory:  as needed - based on report of recent ingestion of acetaminophen ( states she ingested acetaminophen tablets but vomited them soon after) will check LFTs in AM  Psychotherapy: milieu, group therapy     Medications:  Patient agrees to an antidepressant- we discussed options and she agrees to [employer] trial, which she has not been on in the past . We discussed side effects. Start CELEXA 10 mgr QDAY . Start BUSPAR 7.5 mgrs BID for anxiety  Consultations:  As needed   Discharge Concerns:  -  Estimated LOS: 5 days  Other:     Physician Treatment Plan for Primary Diagnosis:  MDD, no psychotic features  Long Term Goal(s): Improvement in symptoms so as ready for discharge  Short Term Goals: Ability to identify changes in lifestyle to reduce recurrence of condition will improve, Ability to verbalize feelings will improve, Ability to disclose and discuss suicidal ideas, Ability to demonstrate self-control will improve, Ability to identify and develop effective coping behaviors will improve and Ability to maintain clinical measurements within normal limits will improve  Physician Treatment Plan for Secondary Diagnosis:  Suicidal Ideations Long Term Goal(s): Improvement in symptoms so as ready for discharge  Short Term Goals: Ability to identify changes in lifestyle to reduce recurrence of condition will improve, Ability to verbalize feelings will improve, Ability to disclose and discuss suicidal ideas, Ability to demonstrate self-control will improve, Ability to identify and develop effective coping behaviors will improve and Ability to maintain clinical measurements within normal limits will improve  I certify that inpatient services furnished can reasonably be expected to improve the patient's condition.    Craige Cotta, MD 2/21/20193:10 PM

## 2017-08-19 NOTE — Progress Notes (Signed)
Patient ID: Cassidy MoosAnastasia A Vullo, female   DOB: 04-09-1996, 22 y.o.   MRN: [id number]  Pt currently presents with a flat affect and guarded behavior. Pt answers questions nervously, smiles inappropriately. Pt cooperative and pleasant after multiple interactions. Pt currently reading a collection of [employer] stories, brightens during discussion. Pt reports no current concerns or questions. Pt does endorse difficulty sleeping PTA. Reports she would "have a hard time sleeping at night, then go to sleep in the morning and not be able to wake up until late." Pt requests sleep aid tonight.  Pt provided with medications per providers orders. Pt's labs and vitals were monitored throughout the night. Pt given a 1:1 about emotional and mental status. Pt supported and encouraged to express concerns and questions. Pt educated on medications, Trazodone.   Pt's safety ensured with 15 minute and environmental checks. Pt currently denies SI/HI and A/V hallucinations. Pt verbally agrees to seek staff if SI/HI or A/VH occurs and to consult with staff before acting on any harmful thoughts. Will continue POC.

## 2017-08-20 DIAGNOSIS — R450 Nervousness: Secondary | ICD-10-CM

## 2017-08-20 DIAGNOSIS — F419 Anxiety disorder, unspecified: Secondary | ICD-10-CM

## 2017-08-20 DIAGNOSIS — G4700 Insomnia, unspecified: Secondary | ICD-10-CM

## 2017-08-20 LAB — HEPATIC FUNCTION PANEL
ALBUMIN: 4.2 g/dL (ref 3.5–5.0)
ALT: 9 U/L — AB (ref 14–54)
AST: 14 U/L — AB (ref 15–41)
Alkaline Phosphatase: 58 U/L (ref 38–126)
Total Bilirubin: 0.6 mg/dL (ref 0.3–1.2)
Total Protein: 6.9 g/dL (ref 6.5–8.1)

## 2017-08-20 LAB — TSH: TSH: 5.136 u[IU]/mL — ABNORMAL HIGH (ref 0.350–4.500)

## 2017-08-20 NOTE — Progress Notes (Addendum)
[facility] MD Progress Note  08/20/2017 2:58 PM NYELLE WOLFSON  MRN:  [id number]   Subjective:  Patient reports she is feeling good today. She rates her depression at 5/10 and anxiety at 5/10. She states that she has going to DBT treatment since the middle of January, but doesn't remember the name of the facility or provider. She refuses to attend PHP due to her schedule at college. She states she may be able to do IOP if it fits around her schedule. She denies any SI/HI/AVH and contracts for safety. She denies any medication side effects.  Objective: Patient's chart and findings reviewed and discussed with treatment team. Patient presents in the day room interacting with peers and staff appropriately. Patient is showing improvement, but she seems to have some borderline traits. She is indecisive on her after discharge care. Will continue current medication.   Principal Problem: Major depressive disorder, recurrent severe without psychotic features (HCC) Diagnosis:   Patient Active Problem List   Diagnosis Date Noted  . Major depressive disorder, recurrent severe without psychotic features (HCC) [F33.2] 08/18/2017  . ADHD, predominantly inattentive type [F90.0] 04/02/2017  . Anxiety [F41.9] 11/07/2013   Total Time spent with patient: 15 minutes  Past Psychiatric History: See H&P  Past Medical History:  Past Medical History:  Diagnosis Date  . ADHD, predominantly inattentive type   . Anxiety   . Depression   . Headache     Past Surgical History:  Procedure Laterality Date  . NO PAST SURGERIES     Family History:  Family History  Problem Relation Age of Onset  . Diabetes Mother   . Heart disease Maternal Grandmother    Family Psychiatric  History: See H&P Social History:  Social History   Substance and Sexual Activity  Alcohol Use Yes  . Alcohol/week: 0.0 - 2.4 oz   Comment: Varied; BAC not available     Social History   Substance and Sexual Activity  Drug Use No     Social History   Socioeconomic History  . Marital status: Single    Spouse name: n/a  . Number of children: 0  . Years of education: None  . Highest education level: None  Social Needs  . Financial resource strain: None  . Food insecurity - worry: None  . Food insecurity - inability: None  . Transportation needs - medical: None  . Transportation needs - non-medical: None  Occupational History  . Occupation: student    Comment: [employer], anticipated graduation 05/2018  Tobacco Use  . Smoking status: Never Smoker  . Smokeless tobacco: Never Used  Substance and Sexual Activity  . Alcohol use: Yes    Alcohol/week: 0.0 - 2.4 oz    Comment: Varied; BAC not available  . Drug use: No  . Sexual activity: Yes    Partners: Male    Birth control/protection: None  Other Topics Concern  . None  Social History Narrative   Grew up in [place], [place].   Her family lives in [place], [place].   Lives off campus with roommates.       Additional Social History:                         Sleep: Good  Appetite:  Good  Current Medications: Current Facility-Administered Medications  Medication Dose Route Frequency Provider Last Rate Last Dose  . acetaminophen (TYLENOL) tablet 650 mg  650 mg Oral Q6H PRN Charm Rings, NP      .  alum & mag hydroxide-simeth (MAALOX/MYLANTA) 200-200-20 MG/5ML suspension 30 mL  30 mL Oral Q4H PRN Charm Rings, NP      . citalopram (CELEXA) tablet 10 mg  10 mg Oral Daily Bronte Kropf, Rockey Situ, MD   10 mg at 08/20/17 1610  . hydrOXYzine (ATARAX/VISTARIL) tablet 25 mg  25 mg Oral Q6H PRN Ellysia Char, Rockey Situ, MD   25 mg at 08/19/17 1606  . magnesium hydroxide (MILK OF MAGNESIA) suspension 30 mL  30 mL Oral Daily PRN Charm Rings, NP      . traZODone (DESYREL) tablet 50 mg  50 mg Oral QHS,MR X 1 Kerry Hough, PA-C   50 mg at 08/19/17 2123    Lab Results:  Results for orders placed or performed during the hospital encounter of  08/18/17 (from the past 48 hour(s))  Hepatic function panel     Status: Abnormal   Collection Time: 08/20/17  6:20 AM  Result Value Ref Range   Total Protein 6.9 6.5 - 8.1 g/dL   Albumin 4.2 3.5 - 5.0 g/dL   AST 14 (L) 15 - 41 U/L   ALT 9 (L) 14 - 54 U/L   Alkaline Phosphatase 58 38 - 126 U/L   Total Bilirubin 0.6 0.3 - 1.2 mg/dL   Bilirubin, Direct <9.6 (L) 0.1 - 0.5 mg/dL   Indirect Bilirubin NOT CALCULATED 0.3 - 0.9 mg/dL    Comment: Performed at [facility], [address], [place], [place] [address]  TSH     Status: Abnormal   Collection Time: 08/20/17  6:20 AM  Result Value Ref Range   TSH 5.136 (H) 0.350 - 4.500 uIU/mL    Comment: Performed by a 3rd Generation assay with a functional sensitivity of <=0.01 uIU/mL. Performed at [facility], [address] [place], [place] [address]     Blood Alcohol level:  Lab Results  Component Value Date   ETH <10 08/17/2017    Metabolic Disorder Labs: No results found for: HGBA1C, MPG No results found for: PROLACTIN No results found for: CHOL, TRIG, HDL, CHOLHDL, VLDL, LDLCALC  Physical Findings: AIMS: Facial and Oral Movements Muscles of Facial Expression: None, normal Lips and Perioral Area: None, normal Jaw: None, normal Tongue: None, normal,Extremity Movements Upper (arms, wrists, hands, fingers): None, normal Lower (legs, knees, ankles, toes): None, normal, Trunk Movements Neck, shoulders, hips: None, normal, Overall Severity Severity of abnormal movements (highest score from questions above): None, normal Incapacitation due to abnormal movements: None, normal Patient's awareness of abnormal movements (rate only patient's report): No Awareness, Dental Status Current problems with teeth and/or dentures?: No Does patient usually wear dentures?: No  CIWA:    COWS:     Musculoskeletal: Strength & Muscle Tone: within normal limits Gait & Station: normal Patient leans:  N/A  Psychiatric Specialty Exam: Physical Exam  Nursing note and vitals reviewed. Constitutional: She is oriented to person, place, and time. She appears well-developed and well-nourished.  Respiratory: Effort normal.  Musculoskeletal: Normal range of motion.  Neurological: She is alert and oriented to person, place, and time.  Skin: Skin is warm.    Review of Systems  Constitutional: Negative.   HENT: Negative.   Eyes: Negative.   Respiratory: Negative.   Cardiovascular: Negative.   Gastrointestinal: Negative.   Genitourinary: Negative.   Musculoskeletal: Negative.   Skin: Negative.   Neurological: Negative.   Endo/Heme/Allergies: Negative.   Psychiatric/Behavioral: Positive for depression. Negative for hallucinations and suicidal ideas. The patient is  nervous/anxious.     Blood pressure 107/79, pulse (!) 108, temperature 97.9 F (36.6 C), temperature source Oral, resp. rate 18, height 5\' 1"  (1.549 m), weight 53.5 kg (118 lb).Body mass index is 22.3 kg/m.  General Appearance: Casual  Eye Contact:  Good  Speech:  Clear and Coherent and Normal Rate  Volume:  Normal  Mood:  Depressed  Affect:  Flat  Thought Process:  Goal Directed and Descriptions of Associations: Intact  Orientation:  Full (Time, Place, and Person)  Thought Content:  WDL  Suicidal Thoughts:  No  Homicidal Thoughts:  No  Memory:  Immediate;   Good Recent;   Good Remote;   Good  Judgement:  Good  Insight:  Good  Psychomotor Activity:  Normal  Concentration:  Concentration: Good and Attention Span: Good  Recall:  Good  Fund of Knowledge:  Good  Language:  Good  Akathisia:  No  Handed:  Right  AIMS (if indicated):     Assets:  Communication Skills Desire for Improvement Financial Resources/Insurance Housing Physical Health Social Support Transportation  ADL's:  Intact  Cognition:  WNL  Sleep:  Number of Hours: 6.5   Problems Addressed: MDD severe  Treatment Plan Summary: Daily contact  with patient to assess and evaluate symptoms and progress in treatment, Medication management and Plan is to:  -Continue Celexa 10 mg PO Daily for mood stability -Continue Vistaril 25 mg PO Q6H PRN for anxiety -Continue Trazodone 50 mg PO QHS PRN for insomnia -Encourage group therapy participation  Maryfrances Bunnellravis B Money, FNP 08/20/2017, 2:58 PM   Agree with NP Progress Note

## 2017-08-20 NOTE — Progress Notes (Signed)
D: Patient observed isolative to self much of this shift. Cautious, guarded on approach. Minimal information forwarded. Patient superficial in her interactions and appears to minimize events PTA.  Patient's affect anxious with congruent mood. Per self inventory and discussions with writer, rates depression at a 4/10, hopelessness at a 4/10 and anxiety at a 4/10. Rates sleep as fair, appetite as good, energy as normal and concentration as good.  States goal for today is to "stay positive." Denies pain, physical complaints.   A: Medicated per orders, no prns requested or required. Level III obs in place for safety. Emotional support offered and self inventory reviewed. Encouraged completion of Suicide Safety Plan and programming participation. Discussed POC with MD, SW.    R: Patient verbalizes understanding of POC. Patient denies SI/HI/AVH and remains safe on level III obs. Will continue to monitor closely and make verbal contact frequently.

## 2017-08-20 NOTE — Progress Notes (Signed)
Recreation Therapy Notes  Date: 08/20/17 Time: 0930 Location: [address] Dayroom  Group Topic: Stress Management  Goal Area(s) Addresses:  Patient will verbalize importance of using healthy stress management.  Patient will identify positive emotions associated with healthy stress management.   Behavioral Response: Engaged  Intervention: Stress Management  Activity :  Meditation.  LRT introduced the stress management technique of meditation.  LRT played a meditation from the Calm app the focused on gratitude.  Patients were to listen and follow along with the meditation as it played.  Education:  Stress Management, Discharge Planning.   Education Outcome: Acknowledges edcuation/In group clarification offered/Needs additional education  Clinical Observations/Feedback: Pt attended group.    Caroll RancherMarjette Akio Hudnall, LRT/CTRS         Caroll RancherLindsay, Jahlia Omura A 08/20/2017 11:24 AM

## 2017-08-20 NOTE — Plan of Care (Signed)
Patient verbalizes understanding of information, education provided.  She has not engaged in self harm and denies thoughts to do so.   Patient has been med compliant thus far.

## 2017-08-20 NOTE — [facility] Group Notes (Signed)
  [facility] LCSW Group Therapy Note  Date/Time: 08/20/17, 1315  Type of Therapy/Topic:  Group Therapy:  Emotion Regulation  Participation Level:  Minimal   Mood:pleasant  Description of Group:    The purpose of this group is to assist patients in learning to regulate negative emotions and experience positive emotions. Patients will be guided to discuss ways in which they have been vulnerable to their negative emotions. These vulnerabilities will be juxtaposed with experiences of positive emotions or situations, and patients challenged to use positive emotions to combat negative ones. Special emphasis will be placed on coping with negative emotions in conflict situations, and patients will process healthy conflict resolution skills.  Therapeutic Goals: 1. Patient will identify two positive emotions or experiences to reflect on in order to balance out negative emotions:  2. Patient will label two or more emotions that they find the most difficult to experience:  3. Patient will be able to demonstrate positive conflict resolution skills through discussion or role plays:   Summary of Patient Progress:Pt identified rejection and envy as difficult emotions to experience.  Pt made several comments in response to CSW questions during group discussion regarding positive ways to handle difficult emotions.         Therapeutic Modalities:   Cognitive Behavioral Therapy Feelings Identification Dialectical Behavioral Therapy  Cassidy SquibbGreg Kasim Mccorkle, LCSW

## 2017-08-20 NOTE — Progress Notes (Signed)
Writer spoke with patient 1:1 after her visitors left and she reports that she has had a good day and remained positive. She [place] and she has a great support system there also. She reports learning coping skills since being here and is focusing on herself now and being okay with being herself. Writer praised her for her goals she has set and encouraged her to follow through with them. She has been up in the dayroom and plans to attend group. Writer informed her of scheduled medication and she plans to take. Safety maintained with 15 min checks.

## 2017-08-20 NOTE — [facility] Group Notes (Signed)
[facility] LCSW Group Therapy Note  Date/Time: 08/19/17, 1315  Type of Therapy/Topic:  Group Therapy:  Balance in Life  Participation Level:  minimal  Description of Group:    This group will address the concept of balance and how it feels and looks when one is unbalanced. Patients will be encouraged to process areas in their lives that are out of balance, and identify reasons for remaining unbalanced. Facilitators will guide patients utilizing problem- solving interventions to address and correct the stressor making their life unbalanced. Understanding and applying boundaries will be explored and addressed for obtaining  and maintaining a balanced life. Patients will be encouraged to explore ways to assertively make their unbalanced needs known to significant others in their lives, using other group members and facilitator for support and feedback.  Therapeutic Goals: 1. Patient will identify two or more emotions or situations they have that consume much of in their lives. 2. Patient will identify signs/triggers that life has become out of balance:  3. Patient will identify two ways to set boundaries in order to achieve balance in their lives:  4. Patient will demonstrate ability to communicate their needs through discussion and/or role plays  Summary of Patient Progress:Pt identified work, school, creative, and mental emotional as areas that are out of balance.  Pt was attentive but minimally participated during group discussion regarding ways to address these areas in life.            Therapeutic Modalities:   Cognitive Behavioral Therapy Solution-Focused Therapy Assertiveness Training  Daleen SquibbGreg Karriem Muench, [place]

## 2017-08-20 NOTE — [facility] Counselor (Signed)
Adult Comprehensive Assessment  Patient ID: Cassidy Livingston, female   DOB: 04-20-1996, 22 y.o.   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 [employer] Social relationships: pt reports problems with an ex boyfriend  Living/Environment/Situation:  Living Arrangements: Other (Comment) Living conditions (as described by patient or guardian): goes good How long has patient lived in current situation?: since fall semester 2018 What is atmosphere in current home: Comfortable, Supportive  Family History:  Marital status: Long term relationship Long term relationship, how long?: 7 months What types of issues is patient dealing with in the relationship?: Pt reports this has been on again off again and very stressful Are you sexually active?: Yes What is your sexual orientation?: heterosexual Has your sexual activity been affected by drugs, alcohol, medication, or emotional stress?: no Does patient have children?: No  Childhood History:  By whom was/is the patient raised?: Both parents Additional childhood history information: Parents are still together.  "Mostly positive".  Mom is somewhat critical Description of patient's relationship with caregiver when they were a child: mom: all right, dad: good Patient's description of current relationship with people who raised him/her: mom: somewhat distant, dad: really great relationship How were you disciplined when you got in trouble as a child/adolescent?: appropriate discipline Does patient have siblings?: Yes Number of Siblings: 1 Description of patient's current relationship with siblings: younger brother.  "We fight and annoy each other." Did patient suffer any verbal/emotional/physical/sexual abuse as a child?: No Did patient suffer from severe childhood neglect?: No Has patient ever been sexually  abused/assaulted/raped as an adolescent or adult?: No Was the patient ever a victim of a crime or a disaster?: No Witnessed domestic violence?: No Has patient been effected by domestic violence as an adult?: No  Education:  Highest grade of school patient has completed: 3rd year of college Currently a student?: Yes How long has the patient attended?: 3 years Learning disability?: No  Employment/Work Situation:   Employment situation: Surveyor, minerals job has been impacted by current illness: (na) What is the longest time patient has a held a job?: summers/breaks Where was the patient employed at that time?: [employer] Has patient ever been in the [employer]?: No Are There Guns or Other Weapons in Your Home?: No  Financial Resources:   Surveyor, quantity resources: Support from parents / caregiver, Income from employment Does patient have a Lawyer or guardian?: No  Alcohol/Substance Abuse:   What has been your use of drugs/alcohol within the last 12 months?: alcohol: <1x month.  drugs: denies If attempted suicide, did drugs/alcohol play a role in this?: No Alcohol/Substance Abuse Treatment Hx: Denies past history Has alcohol/substance abuse ever caused legal problems?: No  Social Support System:   Patient's Community Support System: Good Describe Community Support System: parents, coworkers/friends Type of faith/religion: none How does patient's faith help to cope with current illness?: na  Leisure/Recreation:   Leisure and Hobbies: Surveyor, quantity, sing, Camera operator, cross sticth, movies/tv  Strengths/Needs:   What things does the patient do well?: good performer (theatre), good listener In what areas does patient struggle / problems for patient: "letting go of toxic people", moving forward with life, dwelling on the past  Discharge Plan:   Does patient have access to transportation?: Yes Will patient be returning to same living situation after discharge?: Yes Currently  receiving community mental health services: [facility] Does patient  have financial barriers related to discharge medications?: No  Summary/Recommendations:   Summary and Recommendations (to be completed by the evaluator): [place] 22 year [place] [employer] [place].  Pt is diagnosed with major depressive disorder and was admitted due to increased depression and a suicide attempt.  Primary stressor was conflict with an ex boyfriend.  Recommendations for pt include crisis stabilization, therapeutic milue, attend and participate in groups, medication management, and development of comprehensive mental wellness plan.  Lorri FrederickWierda, Zaniya Mcaulay Jon. 08/20/2017

## 2017-08-20 NOTE — Tx Team (Signed)
Interdisciplinary Treatment and Diagnostic Plan Update  08/20/2017 Time of Session: 1037 Cassidy Livingston MRN: [id number]  Principal Diagnosis: <principal problem not specified>  Secondary Diagnoses: Active Problems:   Major depressive disorder, recurrent severe without psychotic features (HCC)   Current Medications:  Current Facility-Administered Medications  Medication Dose Route Frequency Provider Last Rate Last Dose  . acetaminophen (TYLENOL) tablet 650 mg  650 mg Oral Q6H PRN Charm Rings, NP      . alum & mag hydroxide-simeth (MAALOX/MYLANTA) 200-200-20 MG/5ML suspension 30 mL  30 mL Oral Q4H PRN Charm Rings, NP      . citalopram (CELEXA) tablet 10 mg  10 mg Oral Daily Cobos, Rockey Situ, MD   10 mg at 08/20/17 7829  . hydrOXYzine (ATARAX/VISTARIL) tablet 25 mg  25 mg Oral Q6H PRN Cobos, Rockey Situ, MD   25 mg at 08/19/17 1606  . magnesium hydroxide (MILK OF MAGNESIA) suspension 30 mL  30 mL Oral Daily PRN Charm Rings, NP      . traZODone (DESYREL) tablet 50 mg  50 mg Oral QHS,MR X 1 Kerry Hough, PA-C   50 mg at 08/19/17 2123   PTA Medications: Medications Prior to Admission  Medication Sig Dispense Refill Last Dose  . acetaminophen (TYLENOL) 500 MG tablet Take 1,000 mg by mouth every 6 (six) hours as needed for moderate pain.   08/16/2017 at Unknown time  . buPROPion (WELLBUTRIN XL) 150 MG 24 hr tablet Take 1 tablet (150 mg total) by mouth daily. (Patient not taking: Reported on 08/17/2017) 90 tablet 0 Not Taking at Unknown time  . etonogestrel-ethinyl estradiol (NUVARING) 0.12-0.015 MG/24HR vaginal ring Place 1 each vaginally every 28 (twenty-eight) days. (Patient not taking: Reported on 08/17/2017) 1 each 12 Not Taking at Unknown time  . lamoTRIgine (LAMICTAL) 25 MG tablet Take 25 mg by mouth. 25 mg at bedtime for one week, then take 25 twice daily thereafter  1 Past Week at Unknown time  . lisdexamfetamine (VYVANSE) 30 MG capsule Take 1 capsule (30 mg total) by  mouth daily. (Patient not taking: Reported on 08/17/2017) 30 capsule 0 Not Taking at Unknown time  . Melatonin 5 MG TABS Take 5 mg by mouth once.    08/16/2017 at Unknown time    Patient Stressors: Educational concerns Marital or family conflict Medication change or noncompliance  Patient Strengths: Ability for insight Average or above average intelligence Capable of independent living [employer] of knowledge Motivation for treatment/growth  Treatment Modalities: Medication Management, Group therapy, Case management,  1 to 1 session with clinician, Psychoeducation, Recreational therapy.   Physician Treatment Plan for Primary Diagnosis: <principal problem not specified> Long Term Goal(s): Improvement in symptoms so as ready for discharge Improvement in symptoms so as ready for discharge   Short Term Goals: Ability to identify changes in lifestyle to reduce recurrence of condition will improve Ability to verbalize feelings will improve Ability to disclose and discuss suicidal ideas Ability to demonstrate self-control will improve Ability to identify and develop effective coping behaviors will improve Ability to maintain clinical measurements within normal limits will improve Ability to identify changes in lifestyle to reduce recurrence of condition will improve Ability to verbalize feelings will improve Ability to disclose and discuss suicidal ideas Ability to demonstrate self-control will improve Ability to identify and develop effective coping behaviors will improve Ability to maintain clinical measurements within normal limits will improve  Medication Management: Evaluate patient's response, side effects, and tolerance of medication regimen.  Therapeutic  Interventions: 1 to 1 sessions, Unit Group sessions and Medication administration.  Evaluation of Outcomes: Progressing  Physician Treatment Plan for Secondary Diagnosis: Active Problems:   Major depressive disorder,  recurrent severe without psychotic features (HCC)  Long Term Goal(s): Improvement in symptoms so as ready for discharge Improvement in symptoms so as ready for discharge   Short Term Goals: Ability to identify changes in lifestyle to reduce recurrence of condition will improve Ability to verbalize feelings will improve Ability to disclose and discuss suicidal ideas Ability to demonstrate self-control will improve Ability to identify and develop effective coping behaviors will improve Ability to maintain clinical measurements within normal limits will improve Ability to identify changes in lifestyle to reduce recurrence of condition will improve Ability to verbalize feelings will improve Ability to disclose and discuss suicidal ideas Ability to demonstrate self-control will improve Ability to identify and develop effective coping behaviors will improve Ability to maintain clinical measurements within normal limits will improve     Medication Management: Evaluate patient's response, side effects, and tolerance of medication regimen.  Therapeutic Interventions: 1 to 1 sessions, Unit Group sessions and Medication administration.  Evaluation of Outcomes: Progressing   RN Treatment Plan for Primary Diagnosis: <principal problem not specified> Long Term Goal(s): Knowledge of disease and therapeutic regimen to maintain health will improve  Short Term Goals: Ability to identify and develop effective coping behaviors will improve and Compliance with prescribed medications will improve  Medication Management: RN will administer medications as ordered by provider, will assess and evaluate patient's response and provide education to patient for prescribed medication. RN will report any adverse and/or side effects to prescribing provider.  Therapeutic Interventions: 1 on 1 counseling sessions, Psychoeducation, Medication administration, Evaluate responses to treatment, Monitor vital signs and CBGs  as ordered, Perform/monitor CIWA, COWS, AIMS and Fall Risk screenings as ordered, Perform wound care treatments as ordered.  Evaluation of Outcomes: Progressing   LCSW Treatment Plan for Primary Diagnosis: <principal problem not specified> Long Term Goal(s): Safe transition to appropriate next level of care at discharge, Engage patient in therapeutic group addressing interpersonal concerns.  Short Term Goals: Engage patient in aftercare planning with referrals and resources, Increase social support and Increase skills for wellness and recovery  Therapeutic Interventions: Assess for all discharge needs, 1 to 1 time with Social worker, Explore available resources and support systems, Assess for adequacy in community support network, Educate family and significant other(s) on suicide prevention, Complete Psychosocial Assessment, Interpersonal group therapy.  Evaluation of Outcomes: Progressing   Progress in Treatment: Attending groups: Yes. Participating in groups: Yes. Taking medication as prescribed: Yes. Toleration medication: Yes. Family/Significant other contact made: No, will contact:  when given Patient understands diagnosis: Yes. Discussing patient identified problems/goals with staff: Yes. Medical problems stabilized or resolved: Yes. Denies suicidal/homicidal ideation: Yes. Issues/concerns per patient self-inventory: No. Other: none  New problem(s) identified: No, Describe:  none  New Short Term/Long Term Goal(s):Pt goal: stabilize my mood, stay positive.  Discharge Plan or Barriers:   Reason for Continuation of Hospitalization: Depression Medication stabilization  Estimated Length of Stay: 2-4 days. Attendees: Patient: Cassidy Livingston 08/20/2017   Physician: Dr. Jama Flavors, MD 08/20/2017   Nursing: Merian Capron, RN 08/20/2017   RN Care Manager: 08/20/2017   Social Worker: Daleen Squibb, LCSW 08/20/2017   Recreational Therapist:  08/20/2017   Other:  08/20/2017   Other:   08/20/2017   Other: 08/20/2017         Scribe for Treatment Team: Lorri Frederick, LCSW  08/20/2017 11:38 AM

## 2017-08-20 NOTE — Progress Notes (Signed)
Adult Psychoeducational Group Note  Date:  08/20/2017 Time:  9:23 PM  Group Topic/Focus:  Wrap-Up Group:   The focus of this group is to help patients review their daily goal of treatment and discuss progress on daily workbooks.  Participation Level:  Active  Participation Quality:  Appropriate and Attentive  Affect:  Appropriate  Cognitive:  Alert, Appropriate and Oriented  Insight: Good  Engagement in Group:  Engaged  Modes of Intervention:  Discussion  Additional Comments:  Pt said she had a good day and rated it a 8/10. Pt was working on staying positive today, which she did, and is planning on working on the same thing tomorrow.  Leo GrosserMegan A Denyse Fillion 08/20/2017, 9:23 PM

## 2017-08-21 NOTE — Progress Notes (Signed)
[facility] MD Progress Note  08/21/2017 8:31 AM Cassidy Livingston  MRN:  [id number]   Subjective:  Patient reports she is feeling better. At this time minimizes depression, and states " I am feeling OK today". States she had a good visit with parents yesterday and feels she has good support system. Denies medication side effects thus far ( Celexa is new medication trial for patient). Denies suicidal ideations today.   Objective: I have reviewed chart notes and have met with patient.  She reports improved mood and today minimizes depression or significant neuro-vegetative symptoms of depression. Denies suicidal ideations and presents future oriented. States she works seasonally at [employer], and enjoys her time there so is thinking to transfer college to [place] area, relocate to [place].  Behavior on unit in good control, visible in milieu, calm, pleasant on approach. Denies medication side effects- tolerating Celexa well thus far- we have reviewed side effects, including potential risk of increased suicidal ideations early in treatment with antidepressants in young adults. Labs reviewed- LFTs unremarkable, TSH mildly elevated (5.13)   Principal Problem: Major depressive disorder, recurrent severe without psychotic features ([place]) Diagnosis:   Patient Active Problem List   Diagnosis Date Noted  . Major depressive disorder, recurrent severe without psychotic features ([place]) [F33.2] 08/18/2017  . ADHD, predominantly inattentive type [F90.0] 04/02/2017  . Anxiety [F41.9] 11/07/2013   Total Time spent with patient: 20 minutes   Past Psychiatric History: See H&P  Past Medical History:  Past Medical History:  Diagnosis Date  . ADHD, predominantly inattentive type   . Anxiety   . Depression   . Headache     Past Surgical History:  Procedure Laterality Date  . NO PAST SURGERIES     Family History:  Family History  Problem Relation Age of Onset  . Diabetes Mother   . Heart disease Maternal  Grandmother    Family Psychiatric  History: See H&P Social History:  Social History   Substance and Sexual Activity  Alcohol Use Yes  . Alcohol/week: 0.0 - 2.4 oz   Comment: Varied; BAC not available     Social History   Substance and Sexual Activity  Drug Use No    Social History   Socioeconomic History  . Marital status: Single    Spouse name: n/a  . Number of children: 0  . Years of education: None  . Highest education level: None  Social Needs  . Financial resource strain: None  . Food insecurity - worry: None  . Food insecurity - inability: None  . Transportation needs - medical: None  . Transportation needs - non-medical: None  Occupational History  . Occupation: student    Comment: [employer], anticipated graduation 05/2018  Tobacco Use  . Smoking status: Never Smoker  . Smokeless tobacco: Never Used  Substance and Sexual Activity  . Alcohol use: Yes    Alcohol/week: 0.0 - 2.4 oz    Comment: Varied; BAC not available  . Drug use: No  . Sexual activity: Yes    Partners: Male    Birth control/protection: None  Other Topics Concern  . None  Social History Narrative   Grew up in [place], [place].   Her family lives in [place], [place].   Lives off campus with roommates.       Additional Social History:  \ Sleep: Good  Appetite:  Good  Current Medications: Current Facility-Administered Medications  Medication Dose Route Frequency Provider Last Rate Last Dose  . acetaminophen (TYLENOL) tablet 650 mg  650 mg Oral Q6H PRN Patrecia Pour, NP      . alum & mag hydroxide-simeth (MAALOX/MYLANTA) 200-200-20 MG/5ML suspension 30 mL  30 mL Oral Q4H PRN Patrecia Pour, NP      . citalopram (CELEXA) tablet 10 mg  10 mg Oral Daily Cobos, Myer Peer, MD   10 mg at 08/20/17 2010  . hydrOXYzine (ATARAX/VISTARIL) tablet 25 mg  25 mg Oral Q6H PRN Cobos, Myer Peer, MD   25 mg at 08/20/17 2300  . magnesium hydroxide (MILK OF MAGNESIA) suspension 30 mL  30  mL Oral Daily PRN Patrecia Pour, NP      . traZODone (DESYREL) tablet 50 mg  50 mg Oral QHS,MR X 1 Laverle Hobby, PA-C   50 mg at 08/20/17 2259    Lab Results:  Results for orders placed or performed during the hospital encounter of 08/18/17 (from the past 48 hour(s))  Hepatic function panel     Status: Abnormal   Collection Time: 08/20/17  6:20 AM  Result Value Ref Range   Total Protein 6.9 6.5 - 8.1 g/dL   Albumin 4.2 3.5 - 5.0 g/dL   AST 14 (L) 15 - 41 U/L   ALT 9 (L) 14 - 54 U/L   Alkaline Phosphatase 58 38 - 126 U/L   Total Bilirubin 0.6 0.3 - 1.2 mg/dL   Bilirubin, Direct <0.1 (L) 0.1 - 0.5 mg/dL   Indirect Bilirubin NOT CALCULATED 0.3 - 0.9 mg/dL    Comment: Performed at [facility], [place] [address] [place], [place] [address]  TSH     Status: Abnormal   Collection Time: 08/20/17  6:20 AM  Result Value Ref Range   TSH 5.136 (H) 0.350 - 4.500 uIU/mL    Comment: Performed by a 3rd Generation assay with a functional sensitivity of <=0.01 uIU/mL. Performed at [facility], [place] [address], [place], [place] [address]     Blood Alcohol level:  Lab Results  Component Value Date   ETH <10 32/54/9826    Metabolic Disorder Labs: No results found for: HGBA1C, MPG No results found for: PROLACTIN No results found for: CHOL, TRIG, HDL, CHOLHDL, VLDL, LDLCALC  Physical Findings: AIMS: Facial and Oral Movements Muscles of Facial Expression: None, normal Lips and Perioral Area: None, normal Jaw: None, normal Tongue: None, normal,Extremity Movements Upper (arms, wrists, hands, fingers): None, normal Lower (legs, knees, ankles, toes): None, normal, Trunk Movements Neck, shoulders, hips: None, normal, Overall Severity Severity of abnormal movements (highest score from questions above): None, normal Incapacitation due to abnormal movements: None, normal Patient's awareness of abnormal movements (rate only patient's report): No Awareness,  Dental Status Current problems with teeth and/or dentures?: No Does patient usually wear dentures?: No  CIWA:    COWS:     Musculoskeletal: Strength & Muscle Tone: within normal limits Gait & Station: normal Patient leans: N/A  Psychiatric Specialty Exam: Physical Exam  Nursing note and vitals reviewed. Constitutional: She is oriented to person, place, and time. She appears well-developed and well-nourished.  Respiratory: Effort normal.  Musculoskeletal: Normal range of motion.  Neurological: She is alert and oriented to person, place, and time.  Skin: Skin is warm.    Review of Systems  Constitutional: Negative.   HENT: Negative.   Eyes: Negative.   Respiratory: Negative.   Cardiovascular: Negative.   Gastrointestinal: Negative.   Genitourinary: Negative.   Musculoskeletal: Negative.   Skin: Negative.   Neurological: Negative.   Endo/Heme/Allergies: Negative.  Psychiatric/Behavioral: Positive for depression. Negative for hallucinations and suicidal ideas. The patient is nervous/anxious.   denies headache, denies chest pain, denies shortness of breath, no vomiting , no fever or chills   Blood pressure 107/79, pulse (!) 108, temperature 97.9 F (36.6 C), temperature source Oral, resp. rate 18, height 5' 1"  (1.549 m), weight 53.5 kg (118 lb).Body mass index is 22.3 kg/m.  General Appearance: well groomed   Eye Contact:  Good  Speech:  Normal Rate  Volume:  Normal  Mood:  reports she is feeling better   Affect:  more reactive, smiles at times appropriately   Thought Process:  Linear and Descriptions of Associations: Intact  Orientation:  Full (Time, Place, and Person)  Thought Content:  no hallucinations, no delusions, not internally preoccupied   Suicidal Thoughts:  No - denies any suicidal or self injurious ideations, denies any homicidal or violent ideations, contracts for safety on unit                                           Homicidal Thoughts:  No  Memory:   recent and remote grossly intact   Judgement:  Other:  improving   Insight:  improving   Psychomotor Activity:  Normal  Concentration:  Concentration: Good and Attention Span: Good  Recall:  Good  Fund of Knowledge:  Good  Language:  Good  Akathisia:  No  Handed:  Right  AIMS (if indicated):     Assets:  Communication Skills Desire for Improvement Financial Resources/Insurance Housing Physical Health Social Support Transportation  ADL's:  Intact  Cognition:  WNL  Sleep:  Number of Hours: 6.5   Assessment - patient reports improving mood and today minimizes sadness or significant neuro-vegetative symptoms. Denies SI. She is tolerating Celexa trial well thus far. Presents future oriented at present and describes a plan of relocating to [place], [place] area in the near future . TSH mildly elevated but does not endorse symptoms of hypothyroidism .   Treatment Plan Summary: Treatment Plan reviewed as below today 2/23  Daily contact with patient to assess and evaluate symptoms and progress in treatment, Medication management and Plan is to:  -Continue Celexa 10 mg PO Daily for mood stability -Continue Vistaril 25 mg PO Q6H PRN for anxiety -Continue Trazodone 50 mg PO QHS PRN for insomnia -Check FT3, FT4  -Encourage group therapy participation to work on Child psychotherapist and symptom reduction -Treatment team working on disposition planning   Jenne Campus, MD 08/21/2017, 8:31 AM   Patient ID: Cassidy Livingston, female   DOB: July 16, 1995, 22 y.o.   MRN: [id number]

## 2017-08-21 NOTE — [facility] Group Notes (Signed)
LCSW Group Therapy Note  08/21/2017 9:30-10:30AM - 300 Hall, 10:30-11:30 - 400 Hall, 11:30-12:00 - 500 Hall  Type of Therapy and Topic:  Group Therapy: Anger Cues and Responses  Participation Level:  Active   Description of Group:   In this group, patients learned how to recognize the physical, cognitive, emotional, and behavioral responses they have to anger-provoking situations.  They identified a recent time they became angry and how they reacted.  They analyzed how their reaction was possibly beneficial and how it was possibly unhelpful.  The group discussed a variety of healthier coping skills that could help with such a situation in the future.  Deep breathing was practiced briefly.  Therapeutic Goals: 1. Patients will remember their last incident of anger and how they felt emotionally and physically, what their thoughts were at the time, and how they behaved. 2. Patients will identify how their behavior at that time worked for them, as well as how it worked against them. 3. Patients will explore possible new behaviors to use in future anger situations. 4. Patients will learn that anger itself is normal and cannot be eliminated, and that healthier reactions can assist with resolving conflict rather than worsening situations.  Summary of Patient Progress:  The patient shared that their most recent time of anger was Monday at her father  and this was because he worried everybody needlessly.  She stated that she was thinking "how dare you" and she was very open to healthy coping skills to work through such anger.  Therapeutic Modalities:   Cognitive Behavioral Therapy  Lynnell ChadMareida J Grossman-Orr  08/21/2017 8:44 AM

## 2017-08-21 NOTE — Progress Notes (Signed)
D Pt is seen at med window this morning. She makes good eye contact. She smiles at Clinical research associatewriter briefly. A She completed her daily assessment and on this she wrote she denied SI today and she rated her depression, hopelessness and anxeity " 3/3/3/", respectively. She is a little uncomfortable during Life SKills group today but she shares personal feelings during the group,adding that she  Is trying to learn to develop healtheir coping skills. R Safety is maintained and poc in place.

## 2017-08-21 NOTE — Progress Notes (Signed)
Patient has been up and active on the unit, attended group this evening and has voiced no complaints. Patient currently denies having pain, -si/hi/a/v hall. Support and encouragement offered, safety maintained on unit, will continue to monitor.  

## 2017-08-22 LAB — T4, FREE: Free T4: 0.7 ng/dL (ref 0.61–1.12)

## 2017-08-22 NOTE — [facility] Group Notes (Signed)
[facility] Group Notes:  (Nursing/MHT/Case Management/Adjunct)  Date:  08/22/2017  Time:  3:01 PM  Type of Therapy:  Psychoeducational Skills  Participation Level:  Active  Participation Quality:  Appropriate  Affect:  Appropriate  Cognitive:  Appropriate  Insight:  Appropriate  Engagement in Group:  Engaged  Modes of Intervention:  Problem-solving  Summary of Progress/Problems: Pt attended Psychoeducational group with top topic love languages.    Jacquelyne BalintForrest, Alarik Radu Shanta 08/22/2017, 3:01 PM

## 2017-08-22 NOTE — [facility] Group Notes (Signed)
[facility] Group Notes:  (Nursing/MHT/Case Management/Adjunct)  Date:  08/22/2017  Time:  9:15 AM  Type of Therapy:  Goals/Orientation Group.  Participation Level:  Active  Participation Quality:  Appropriate  Affect:  Appropriate  Cognitive:  Appropriate  Insight:  Appropriate  Engagement in Group:  Engaged  Modes of Intervention:  Discussion  Summary of Progress/Problems: Pt attended goals/orientation group, pt was receptive.   Cassidy Livingston 08/22/2017, 9:15 AM 

## 2017-08-22 NOTE — Progress Notes (Signed)
[facility] MD Progress Note  08/22/2017 2:06 PM EMY ANGEVINE  MRN:  [id number]   Subjective:  Patient reports she is feeling significantly better than prior to admission. Denies medication side effects. Denies suicidal ideations, and presents future oriented, planning on relocating , transferring college to [place]. Today reports she feels that relationship issues have often been trigger of worsening depression or SI. Reports she struggles with feelings of rejection when having relationship stressors or tension, and describes increased symptoms following break up with BF.  States " I want to work on myself, I want to do DBT " . At present denies medication side effects.    Objective: I have reviewed chart notes and have met with patient.  Patient presents with improving mood , improved range, brighter affect . States she is less depressed and as she improves she is more future oriented, describing plans of relocating to [place] soon and to resume seasonal work at [employer], which she has done in the past and enjoyed. She also describes high level of motivation in therapy following discharge. At this time denies medication side effects ( on Celexa ). TSH mildly elevated- patient does describe family history of hypothyroidism ( father). Behavior on unit calm, in good control, visible on unit, interactive with peers .    Principal Problem: Major depressive disorder, recurrent severe without psychotic features ([place]) Diagnosis:   Patient Active Problem List   Diagnosis Date Noted  . Major depressive disorder, recurrent severe without psychotic features ([place]) [F33.2] 08/18/2017  . ADHD, predominantly inattentive type [F90.0] 04/02/2017  . Anxiety [F41.9] 11/07/2013   Total Time spent with patient: 20 minutes   Past Psychiatric History: See H&P  Past Medical History:  Past Medical History:  Diagnosis Date  . ADHD, predominantly inattentive type   . Anxiety   . Depression   . Headache     Past  Surgical History:  Procedure Laterality Date  . NO PAST SURGERIES     Family History:  Family History  Problem Relation Age of Onset  . Diabetes Mother   . Heart disease Maternal Grandmother    Family Psychiatric  History: See H&P Social History:  Social History   Substance and Sexual Activity  Alcohol Use Yes  . Alcohol/week: 0.0 - 2.4 oz   Comment: Varied; BAC not available     Social History   Substance and Sexual Activity  Drug Use No    Social History   Socioeconomic History  . Marital status: Single    Spouse name: n/a  . Number of children: 0  . Years of education: None  . Highest education level: None  Social Needs  . Financial resource strain: None  . Food insecurity - worry: None  . Food insecurity - inability: None  . Transportation needs - medical: None  . Transportation needs - non-medical: None  Occupational History  . Occupation: student    Comment: [employer], anticipated graduation 05/2018  Tobacco Use  . Smoking status: Never Smoker  . Smokeless tobacco: Never Used  Substance and Sexual Activity  . Alcohol use: Yes    Alcohol/week: 0.0 - 2.4 oz    Comment: Varied; BAC not available  . Drug use: No  . Sexual activity: Yes    Partners: Male    Birth control/protection: None  Other Topics Concern  . None  Social History Narrative   Grew up in [place], [place].   Her family lives in [place], [place].   Lives off campus with roommates.  Additional Social History:  \ Sleep: Good  Appetite:  Good  Current Medications: Current Facility-Administered Medications  Medication Dose Route Frequency Provider Last Rate Last Dose  . acetaminophen (TYLENOL) tablet 650 mg  650 mg Oral Q6H PRN Patrecia Pour, NP      . alum & mag hydroxide-simeth (MAALOX/MYLANTA) 200-200-20 MG/5ML suspension 30 mL  30 mL Oral Q4H PRN Patrecia Pour, NP      . citalopram (CELEXA) tablet 10 mg  10 mg Oral Daily Cobos, Myer Peer, MD   10 mg at  08/22/17 0751  . hydrOXYzine (ATARAX/VISTARIL) tablet 25 mg  25 mg Oral Q6H PRN Cobos, Myer Peer, MD   25 mg at 08/20/17 2300  . magnesium hydroxide (MILK OF MAGNESIA) suspension 30 mL  30 mL Oral Daily PRN Patrecia Pour, NP      . traZODone (DESYREL) tablet 50 mg  50 mg Oral QHS,MR X 1 Laverle Hobby, PA-C   50 mg at 08/21/17 2248    Lab Results:  No results found for this or any previous visit (from the past 48 hour(s)).  Blood Alcohol level:  Lab Results  Component Value Date   ETH <10 89/38/1017    Metabolic Disorder Labs: No results found for: HGBA1C, MPG No results found for: PROLACTIN No results found for: CHOL, TRIG, HDL, CHOLHDL, VLDL, LDLCALC  Physical Findings: AIMS: Facial and Oral Movements Muscles of Facial Expression: None, normal Lips and Perioral Area: None, normal Jaw: None, normal Tongue: None, normal,Extremity Movements Upper (arms, wrists, hands, fingers): None, normal Lower (legs, knees, ankles, toes): None, normal, Trunk Movements Neck, shoulders, hips: None, normal, Overall Severity Severity of abnormal movements (highest score from questions above): None, normal Incapacitation due to abnormal movements: None, normal Patient's awareness of abnormal movements (rate only patient's report): No Awareness, Dental Status Current problems with teeth and/or dentures?: No Does patient usually wear dentures?: No  CIWA:    COWS:     Musculoskeletal: Strength & Muscle Tone: within normal limits Gait & Station: normal Patient leans: N/A  Psychiatric Specialty Exam: Physical Exam  Nursing note and vitals reviewed. Constitutional: She is oriented to person, place, and time. She appears well-developed and well-nourished.  Respiratory: Effort normal.  Musculoskeletal: Normal range of motion.  Neurological: She is alert and oriented to person, place, and time.  Skin: Skin is warm.    Review of Systems  Constitutional: Negative.   HENT: Negative.    Eyes: Negative.   Respiratory: Negative.   Cardiovascular: Negative.   Gastrointestinal: Negative.   Genitourinary: Negative.   Musculoskeletal: Negative.   Skin: Negative.   Neurological: Negative.   Endo/Heme/Allergies: Negative.   Psychiatric/Behavioral: Positive for depression. Negative for hallucinations and suicidal ideas. The patient is nervous/anxious.   denies chest pain, no shortness of breath, no vomiting   Blood pressure 103/60, pulse (!) 108, temperature 98.1 F (36.7 C), resp. rate 16, height 5' 1"  (1.549 m), weight 53.5 kg (118 lb).Body mass index is 22.3 kg/m.  General Appearance: well groomed   Eye Contact:  Good  Speech:  Normal Rate  Volume:  Normal  Mood:  improving   Affect:  Full Range  Thought Process:  Linear and Descriptions of Associations: Intact  Orientation:  Full (Time, Place, and Person)  Thought Content:  no hallucinations, no delusions, not internally preoccupied   Suicidal Thoughts:  No - denies any suicidal or self injurious ideations, denies any homicidal or violent ideations, contracts for safety on unit  Homicidal Thoughts:  No  Memory:  recent and remote grossly intact   Judgement:  Other:  improved  Insight:  improving   Psychomotor Activity:  Normal  Concentration:  Concentration: Good and Attention Span: Good  Recall:  Good  Fund of Knowledge:  Good  Language:  Good  Akathisia:  No  Handed:  Right  AIMS (if indicated):     Assets:  Communication Skills Desire for Improvement Financial Resources/Insurance Housing Physical Health Social Support Transportation  ADL's:  Intact  Cognition:  WNL  Sleep:  Number of Hours: 5.75   Assessment - patient is presenting with improved mood and range of affect, and affect is bright and full in range at this time. Denies suicidal ideations or self injurious ideations and presents future oriented. Currently tolerating medications well ( on Celexa trial )   Treatment Plan  Summary: Treatment Plan reviewed as below today 2/24 Daily contact with patient to assess and evaluate symptoms and progress in treatment, Medication management and Plan is to:  -Continue Celexa 10 mg PO Daily for mood stability -Continue Vistaril 25 mg PO Q6H PRN for anxiety -Continue Trazodone 50 mg PO QHS PRN for insomnia -FT3, FT4 pending -Encourage group therapy participation to work on Child psychotherapist and symptom reduction -Treatment team working on disposition [place], MD 08/22/2017, 2:06 PM   Patient ID: Herbert Moors, female   DOB: 04/13/1996, 22 y.o.   MRN: [id number]

## 2017-08-22 NOTE — [facility] Group Notes (Signed)
[facility] LCSW Group Therapy Note  Date/Time:  08/22/2017  11:00AM-12:00PM  Type of Therapy and Topic:  Group Therapy:  Music and Mood  Participation Level:  Did Not Attend   Description of Group: In this process group, members listened to a variety of genres of music and identified that different types of music evoke different responses.  Patients were encouraged to identify music that was soothing for them and music that was energizing for them.  Patients discussed how this knowledge can help with wellness and recovery in various ways including managing depression and anxiety as well as encouraging healthy sleep habits.    Therapeutic Goals: 1. Patients will explore the impact of different varieties of music on mood 2. Patients will verbalize the thoughts they have when listening to different types of music 3. Patients will identify music that is soothing to them as well as music that is energizing to them 4. Patients will discuss how to use this knowledge to assist in maintaining wellness and recovery 5. Patients will explore the use of music as a coping skill  Summary of Patient Progress:  N/A  Therapeutic Modalities: Solution Focused Brief Therapy Activity   Raequon Catanzaro Grossman-Orr, LCSW    

## 2017-08-22 NOTE — Progress Notes (Signed)
Adult Psychoeducational Group Note  Date:  08/22/2017 Time:  9:20 PM  Group Topic/Focus:  Wrap-Up Group:   The focus of this group is to help patients review their daily goal of treatment and discuss progress on daily workbooks.  Participation Level:  Active  Participation Quality:  Appropriate  Affect:  Appropriate  Cognitive:  Alert and Oriented  Insight: Good  Engagement in Group:  Engaged  Modes of Intervention:  Discussion  Additional Comments:    Leo GrosserMegan A Toma Erichsen 08/22/2017, 9:20 PM

## 2017-08-22 NOTE — Progress Notes (Signed)
Patient has been up and active on the unit, attended group this evening and has voiced no complaints. She reports ready for discharge on tomorrow and will probably spend time with her parents. Patient currently denies having pain, -si/hi/a/v hall. Support and encouragement offered, safety maintained on unit, will continue to monitor.

## 2017-08-23 LAB — T3, FREE: T3, Free: 2.9 pg/mL (ref 2.0–4.4)

## 2017-08-23 MED ORDER — HYDROXYZINE HCL 25 MG PO TABS: 25.0000 mg | ORAL_TABLET | Freq: Four times a day (QID) | ORAL | 0 refills | Status: AC | PRN

## 2017-08-23 MED ORDER — TRAZODONE HCL 50 MG PO TABS: 50.0000 mg | ORAL_TABLET | Freq: Every evening | ORAL | 0 refills | Status: AC | PRN

## 2017-08-23 MED ORDER — CITALOPRAM HYDROBROMIDE 10 MG PO TABS: 10.0000 mg | ORAL_TABLET | Freq: Every day | ORAL | 0 refills | Status: AC

## 2017-08-23 NOTE — Progress Notes (Signed)
D: Patient discharged per MD order.  Patient received all personal belongings from locker and unit.  She is sleeping and eating well;  her energy level is normal; her concentration is good.  She rates her depression and hopelessness as a 4; anxiety as a 5.  She denies any thoughts of self harm.  She denies any physical distress.  She is hopeful regarding discharge.  She will follow up with [facility].  Patient left ambulatory with her dad.  She received prescriptions.

## 2017-08-23 NOTE — [facility] Suicide Risk Assessment (Addendum)
[facility] INPATIENT:  Family/Significant Other Suicide Prevention Education  Suicide Prevention Education:  Contact Attempts: Sheela StackScott Yokoyama, father, [phone], has been identified by the patient as the family member/significant other with whom the patient will be residing, and identified as the person(s) who will aid the patient in the event of a mental health crisis.  With written consent from the patient, two attempts were made to provide suicide prevention education, prior to and/or following the patient's discharge.  We were unsuccessful in providing suicide prevention education.  A suicide education pamphlet was given to the patient to share with family/significant other.  Date and time of first attempt:08/23/17, 0915 Date and time of second attempt: 08/23/17, 1122  Cassidy Livingston, Cassidy Montag Jon, LCSW 08/23/2017, 9:15 AM

## 2017-08-23 NOTE — [facility] Group Notes (Signed)
Adult Psychoeducational Group Note  Date:  08/23/2017 Time:  1:27 PM  Group Topic/Focus:  Wellness Toolbox:   The focus of this group is to discuss various aspects of wellness, balancing those aspects and exploring ways to increase the ability to experience wellness.  Patients will create a wellness toolbox for use upon discharge.  Participation Level:  Active  Participation Quality:  Appropriate  Affect:  Appropriate  Cognitive:  Appropriate  Insight: Good  Engagement in Group:  Improving  Modes of Intervention:  Clarification  Additional Comments:    Donell BeersRodney S Sahasra Belue 08/23/2017, 1:27 PM

## 2017-08-23 NOTE — Progress Notes (Signed)
  [facility]  Will you be returning to the same living situation after discharge:  Yes,  home and school At discharge, do you have transportation home?: Yes,  father Do you have the ability to pay for your medications: Yes,  cigna/BCBS  Release of information consent forms completed and in the chart;  Patient's signature needed at discharge.  Patient to Follow up at: Follow-up Information    [facility]. Call.   Specialty:  Behavioral Health Why:  Due to a missed appt, you must pay $130 fee before a medication appt can be rescheduled with Tamela OddiJo Hughes. Contact information: [address] [place] [address] [phone]        [facility] Follow up.   Why:  We will call you once we receive a call [facility] with your therapy appt date and time. Contact information: [address] [place] [address] [phone]           Next level of care provider [facility] Link:no  Safety Planning and Suicide Prevention discussed: Yes,  with father  Have you used any form of tobacco in the last 30 days? (Cigarettes, Smokeless Tobacco, Cigars, and/or Pipes): No  Has patient been referred to the Quitline?: N/A patient is not a smoker  Patient has been referred for addiction treatment: Yes  Lorri FrederickWierda, Kaycie Pegues Jon, LCSW 08/23/2017, 12:42 PM

## 2017-08-23 NOTE — Plan of Care (Signed)
Completed/Met Activity: Interest or engagement in activities will improve 08/23/2017 0823 - Completed/Met by Joice Lofts, RN Sleeping patterns will improve 08/23/2017 0823 - Completed/Met by Joice Lofts, RN Education: Emotional status will improve 08/23/2017 0823 - Completed/Met by Joice Lofts, RN Mental status will improve 08/23/2017 0823 - Completed/Met by Joice Lofts, RN Coping: Ability to verbalize frustrations and anger appropriately will improve 08/23/2017 0823 - Completed/Met by Joice Lofts, RN Ability to demonstrate self-control will improve 08/23/2017 0823 - Completed/Met by Joice Lofts, RN Health Behavior/Discharge Planning: Identification of resources available to assist in meeting health care needs will improve 08/23/2017 0823 - Completed/Met by Joice Lofts, RN Compliance with treatment plan for underlying cause of condition will improve 08/23/2017 0823 - Completed/Met by Joice Lofts, RN Physical Regulation: Ability to maintain clinical measurements within normal limits will improve 08/23/2017 0823 - Completed/Met by Joice Lofts, RN Activity: Interest or engagement in leisure activities will improve 08/23/2017 0823 - Completed/Met by Joice Lofts, RN Imbalance in normal sleep/wake cycle will improve 08/23/2017 0823 - Completed/Met by Joice Lofts, RN Education: Utilization of techniques to improve thought processes will improve 08/23/2017 0823 - Completed/Met by Joice Lofts, RN Knowledge of the prescribed therapeutic regimen will improve 08/23/2017 0823 - Completed/Met by Joice Lofts, RN Coping: Ability to cope will improve 08/23/2017 0823 - Completed/Met by Joice Lofts, RN Ability to verbalize feelings will improve 08/23/2017 0823 - Completed/Met by Joice Lofts, [place] Behavior/Discharge Planning: Ability to make decisions will improve 08/23/2017 0823 -  Completed/Met by Joice Lofts, RN Compliance with therapeutic regimen will improve 08/23/2017 0823 - Completed/Met by Joice Lofts, RN Role Relationship: Ability to demonstrate positive changes in social behaviors and relationships will improve 08/23/2017 0823 - Completed/Met by Joice Lofts, RN Safety: Ability to disclose and discuss suicidal ideas will improve 08/23/2017 0823 - Completed/Met by Joice Lofts, RN Ability to identify and utilize support systems that promote safety will improve 08/23/2017 0823 - Completed/Met by Joice Lofts, RN Self-Concept: Ability to verbalize positive feelings about self will improve 08/23/2017 0823 - Completed/Met by Joice Lofts, RN Level of anxiety will decrease 08/23/2017 0823 - Completed/Met by Joice Lofts, RN Activity: Will identify at least one activity in which they can participate 08/23/2017 0823 - Completed/Met by Joice Lofts, RN Coping: Ability to identify and develop effective coping behavior will improve 08/23/2017 0823 - Completed/Met by Joice Lofts, RN Ability to interact with others will improve 08/23/2017 0823 - Completed/Met by Joice Lofts, RN Participation in decision-making will improve 08/23/2017 [id number] - Completed/Met by Joice Lofts, RN Ability to use eye contact when communicating with others will improve 08/23/2017 0823 - Completed/Met by Joice Lofts, [place] Behavior/Discharge Planning: Identification of resources available to assist in meeting health care needs will improve 08/23/2017 0823 - Completed/Met by Joice Lofts, RN Self-Concept: Ability to verbalize positive feelings about self will improve 08/23/2017 0823 - Completed/Met by Joice Lofts, RN Education: Ability to make informed decisions regarding treatment will improve 08/23/2017 0823 - Completed/Met by Joice Lofts, RN Coping: Ability to cope will  improve 08/23/2017 0823 - Completed/Met by Joice Lofts, [place] Behavior/Discharge Planning: Identification of resources available to assist in meeting health care needs will improve 08/23/2017 0823 - Completed/Met by Joice Lofts, RN Self-Concept: Ability to disclose and discuss suicidal ideas will improve 08/23/2017 0823 - Completed/Met by Mayra Neer  E, RN Ability to verbalize positive feelings about self will improve 08/23/2017 0823 - Completed/Met by Joice Lofts, RN Spiritual Needs Ability to function at adequate level 08/23/2017 0823 - Completed/Met by Joice Lofts, RN

## 2017-08-23 NOTE — Progress Notes (Signed)
Follow up progress: [facility] will not schedule pt until $130 missed appt fee is paid.  Pt missed her last appt there.  CSW discussed this with pt, who will speak to her parents about it. [facility], Cassidy SaupeHeather Livingston: message was left regarding follow up appt.  CSW spoke with pt that CSW will call with date and time if therapist does not respond prior to her discharge today. Cassidy Livingston, MSW, LCSW Clinical Social Worker 08/23/2017 11:37 AM

## 2017-08-23 NOTE — [facility] Suicide Risk Assessment (Signed)
[facility] INPATIENT:  Family/Significant Other Suicide Prevention Education  Suicide Prevention Education:  Education Completed;Scott Keisling, father, [phone],  has been identified by the patient as the family member/significant other with whom the patient will be residing, and identified as the person(s) who will aid the patient in the event of a mental health crisis (suicidal ideations/suicide attempt).  With written consent from the patient, the family member/significant other has been provided the following suicide prevention education, prior to the and/or following the discharge of the patient.  The suicide prevention education provided includes the following:  Suicide risk factors  Suicide prevention and interventions  National Suicide Hotline telephone number  [facility] assessment telephone number  [facility] Emergency Assistance 911  [facility] and/or Residential Mobile Crisis Unit telephone number  Request made of family/significant other to:  Remove weapons (e.g., guns, rifles, knives), all items previously/currently identified as safety concern. No guns in the home, per [place].   Remove drugs/medications (over-the-counter, prescriptions, illicit drugs), all items previously/currently identified as a safety concern.  The family member/significant other verbalizes understanding of the suicide prevention education information provided.  The family member/significant other agrees to remove the items of safety concern listed above.  Lorin PicketScott was leaving to come pick pt up from the hospital and did not have much time to talk.  He said pt is going to be staying with them for now but [place].  He will be checking in with her regularly moving forward.  Lorri FrederickWierda, Patrisha Hausmann Jon, LCSW 08/23/2017, 12:40 PM

## 2017-08-23 NOTE — Discharge Summary (Addendum)
Physician Discharge Summary Note  Patient:  Cassidy Livingston is an 22 y.o., female MRN:  [id number] DOB:  April 30, 1996 Patient phone:  [phone] (home)  Patient address:   [address] [place] [address],  Total Time spent with patient: 20 minutes  Date of Admission:  08/18/2017 Date of Discharge: 08/23/17  Reason for Admission:  Worsening depression with reported SI  Principal Problem: Major depressive disorder, recurrent severe without psychotic features [facility]) Discharge Diagnoses: Patient Active Problem List   Diagnosis Date Noted  . Major depressive disorder, recurrent severe without psychotic features (HCC) [F33.2] 08/18/2017  . ADHD, predominantly inattentive type [F90.0] 04/02/2017  . Anxiety [F41.9] 11/07/2013    Past Psychiatric History: history of a prior psychiatric admission at age 24 to [facility] for depression, suicidal ideations. Denies history of self cutting.  Reports history of suicide attempts by overdosing, most recently  11/18. Denies history of psychosis. Denies history of PTSD. Denies history of mania or hypomania. Denies history of Eating Disorder   Past Medical History:  Past Medical History:  Diagnosis Date  . ADHD, predominantly inattentive type   . Anxiety   . Depression   . Headache     Past Surgical History:  Procedure Laterality Date  . NO PAST SURGERIES     Family History:  Family History  Problem Relation Age of Onset  . Diabetes Mother   . Heart disease Maternal Grandmother    Family Psychiatric  History: states her father has history of depression and brother has history of ADHD. No suicides in family. Paternal Aunt is alcoholic  Social History:  Social History   Substance and Sexual Activity  Alcohol Use Yes  . Alcohol/week: 0.0 - 2.4 oz   Comment: Varied; BAC not available     Social History   Substance and Sexual Activity  Drug Use No    Social History   Socioeconomic History  . Marital status: Single    Spouse  name: n/a  . Number of children: 0  . Years of education: None  . Highest education level: None  Social Needs  . Financial resource strain: None  . Food insecurity - worry: None  . Food insecurity - inability: None  . Transportation needs - medical: None  . Transportation needs - non-medical: None  Occupational History  . Occupation: student    Comment: [employer], anticipated graduation 05/2018  Tobacco Use  . Smoking status: Never Smoker  . Smokeless tobacco: Never Used  Substance and Sexual Activity  . Alcohol use: Yes    Alcohol/week: 0.0 - 2.4 oz    Comment: Varied; BAC not available  . Drug use: No  . Sexual activity: Yes    Partners: Male    Birth control/protection: None  Other Topics Concern  . None  Social History Narrative   Grew up in [place], [place].   Her family lives in [place], [place].   Lives off campus with roommates.        Hospital Course:   08/19/17 [facility] MD Assessment: 22 year old female, who reports she has been feeling increasingly depressed over recent weeks to months. States " I feel like I am always a little depressed, but I feel it has been getting worse ". States she " disappeared " from her home and checked into a motel . This was triggered by an argument with her ex boyfriend. States " I had moments where I felt I just needed to take some time off and calm down, but  at other moments I felt like overdosing ". States she actually took " a bunch of tylenols", but decided to vomit them. ( 2/19 acetaminophen level <10). States her friends noted she was missing and her parents put out a missing person report, and states police found her at the hotel and she was brought to ED   [facility] and stabilized with medication and therapy. Patient was started on Celexa 10 mg Daily, Vistaril 25 mg PO Q6H PRN, and Trazodone 50 mg QHS PRN. Patient has shown improvement with improved moo, affect, sleep, appetite, and  interaction. Patient has been seen in the day room interacting appropriately. Patient has been attending groups and participating. Patient denies any SI/HI/AVH and contracts for safety. Patient agrees [facility]. Patient is provided with prescriptions for her medications upon discharge.     Physical Findings: AIMS: Facial and Oral Movements Muscles of Facial Expression: None, normal Lips and Perioral Area: None, normal Jaw: None, normal Tongue: None, normal,Extremity Movements Upper (arms, wrists, hands, fingers): None, normal Lower (legs, knees, ankles, toes): None, normal, Trunk Movements Neck, shoulders, hips: None, normal, Overall Severity Severity of abnormal movements (highest score from questions above): None, normal Incapacitation due to abnormal movements: None, normal Patient's awareness of abnormal movements (rate only patient's report): No Awareness, Dental Status Current problems with teeth and/or dentures?: No Does patient usually wear dentures?: No  CIWA:    COWS:     Musculoskeletal: Strength & Muscle Tone: within normal limits Gait & Station: normal Patient leans: N/A  Psychiatric Specialty Exam: Physical Exam  Nursing note and vitals reviewed. Constitutional: She is oriented to person, place, and time. She appears well-developed and well-nourished.  Cardiovascular: Normal rate.  Respiratory: Effort normal.  Musculoskeletal: Normal range of motion.  Neurological: She is alert and oriented to person, place, and time.  Skin: Skin is warm.    Review of Systems  Constitutional: Negative.   HENT: Negative.   Eyes: Negative.   Respiratory: Negative.   Cardiovascular: Negative.   Gastrointestinal: Negative.   Genitourinary: Negative.   Musculoskeletal: Negative.   Skin: Negative.   Neurological: Negative.   Endo/Heme/Allergies: Negative.   Psychiatric/Behavioral: Negative.     Blood pressure 116/77, pulse 82, temperature 98.4 F  (36.9 C), temperature source Oral, resp. rate 16, height 5\' 1"  (1.549 m), weight 53.5 kg (118 lb).Body mass index is 22.3 kg/m.  General Appearance: Casual  Eye Contact:  Good  Speech:  Clear and Coherent and Normal Rate  Volume:  Normal  Mood:  Euthymic  Affect:  Appropriate  Thought Process:  Goal Directed and Descriptions of Associations: Intact  Orientation:  Full (Time, Place, and Person)  Thought Content:  WDL  Suicidal Thoughts:  No  Homicidal Thoughts:  No  Memory:  Immediate;   Good Recent;   Good Remote;   Good  Judgement:  Good  Insight:  Good  Psychomotor Activity:  Normal  Concentration:  Concentration: Good and Attention Span: Good  Recall:  Good  Fund of Knowledge:  Good  Language:  Good  Akathisia:  No  Handed:  Right  AIMS (if indicated):     Assets:  Communication Skills Desire for Improvement Financial Resources/Insurance Housing Physical Health Social Support Transportation  ADL's:  Intact  Cognition:  WNL  Sleep:  Number of Hours: 6     Have you used any form of tobacco in the last 30 days? (Cigarettes, Smokeless Tobacco, Cigars,  and/or Pipes): No  Has this patient used any form of tobacco in the last 30 days? (Cigarettes, Smokeless Tobacco, Cigars, and/or Pipes) Yes, No  Blood Alcohol level:  Lab Results  Component Value Date   ETH <10 08/17/2017    Metabolic Disorder Labs:  No results found for: HGBA1C, MPG No results found for: PROLACTIN No results found for: CHOL, TRIG, HDL, CHOLHDL, VLDL, LDLCALC  See Psychiatric Specialty Exam and Suicide Risk Assessment completed by Attending Physician prior to discharge.  Discharge destination:  Home  Is patient on multiple antipsychotic therapies at discharge:  No   Has Patient had three or more failed trials of antipsychotic monotherapy by history:  No  Recommended Plan for Multiple Antipsychotic Therapies: NA   Allergies as of 08/23/2017   No Known Allergies     Medication List     STOP taking these medications   acetaminophen 500 MG tablet Commonly known as:  TYLENOL   buPROPion 150 MG 24 hr tablet Commonly known as:  WELLBUTRIN XL   etonogestrel-ethinyl estradiol 0.12-0.015 MG/24HR vaginal ring Commonly known as:  NUVARING   lamoTRIgine 25 MG tablet Commonly known as:  LAMICTAL   lisdexamfetamine 30 MG capsule Commonly known as:  VYVANSE   Melatonin 5 MG Tabs     TAKE these medications     Indication  citalopram 10 MG tablet Commonly known as:  CELEXA Take 1 tablet (10 mg total) by mouth daily. For mood control Start taking on:  08/24/2017  Indication:  mood stability   hydrOXYzine 25 MG tablet Commonly known as:  ATARAX/VISTARIL Take 1 tablet (25 mg total) by mouth every 6 (six) hours as needed for anxiety.  Indication:  Feeling Anxious   traZODone 50 MG tablet Commonly known as:  DESYREL Take 1 tablet (50 mg total) by mouth at bedtime and may repeat dose one time if needed.  Indication:  Trouble Sleeping      Follow-up Information    [facility]. Call.   Specialty:  Behavioral Health Why:  Due to a missed appt, you must pay $130 fee before a medication appt can be rescheduled with Tamela Oddi. Contact information: [address] [place] [address] [phone]        [facility] Follow up.   Contact information: [address] [place] [address] [phone]           Follow-up recommendations:  Continue activity as tolerated. Continue diet as recommended by your PCP. Ensure to keep all appointments with outpatient providers.  Comments:  Patient is instructed prior to discharge to: Take all medications as prescribed by his/her mental healthcare provider. Report any adverse effects and or reactions from the medicines to his/her outpatient provider promptly. Patient has been instructed & cautioned: To not engage in alcohol and or  illegal drug use while on prescription medicines. In the event of worsening symptoms, patient is instructed to call the crisis hotline, 911 and or go to the nearest ED for appropriate evaluation and treatment of symptoms. To follow-up with his/her primary care provider for your other medical issues, concerns and or health care needs.    Signed: Gerlene Burdock Money, FNP 08/23/2017, 8:04 AM   Patient seen, Suicide Assessment Completed.  Disposition Plan Reviewed

## 2017-08-23 NOTE — [facility] Suicide Risk Assessment (Addendum)
[facility]   Principal Problem: Major depressive disorder, recurrent severe without psychotic features [facility](HCC) Discharge Diagnoses:  Patient Active Problem List   Diagnosis Date Noted  . Major depressive disorder, recurrent severe without psychotic features (HCC) [F33.2] 08/18/2017  . ADHD, predominantly inattentive type [F90.0] 04/02/2017  . Anxiety [F41.9] 11/07/2013    Total Time spent with patient: 30 minutes  Musculoskeletal: Strength & Muscle Tone: within normal limits Gait & Station: normal Patient leans: N/A  Psychiatric Specialty Exam: ROS denies headache, no chest pain, no shortness of breath, no vomiting  Blood pressure 116/77, pulse 82, temperature 98.4 F (36.9 C), temperature source Oral, resp. rate 16, height 5\' 1"  (1.549 m), weight 53.5 kg (118 lb).Body mass index is 22.3 kg/m.  General Appearance: Well Groomed  Eye Contact::  Good  Speech:  Normal Rate409  Volume:  Normal  Mood:  Euthymic denies feeling depressed, states " my mood is a lot better"  Affect:  Appropriate and Full Range  Thought Process:  Linear and Descriptions of Associations: Intact  Orientation:  Full (Time, Place, and Person)  Thought Content:  no hallucinations , no delusions, not internally preoccupied   Suicidal Thoughts:  No denies any suicidal or self injurious ideations, denies homicidal ideations  Homicidal Thoughts:  No  Memory:  recent and remote grossly intact   Judgement:  Other:  improving   Insight:  improving  Psychomotor Activity:  Normal  Concentration:  Good  Recall:  Good  Fund of Knowledge:Good  Language: Good  Akathisia:  Negative  Handed:  Right  AIMS (if indicated):     Assets:  Communication Skills Desire for Improvement Resilience  Sleep:  Number of Hours: 6  Cognition: WNL  ADL's:  Intact   Mental Status Per Nursing Assessment::   On Admission:     Demographic Factors:  22 year old single female  Loss Factors: Relationship  stressors  Historical Factors: History of one prior psychiatric medication at age 22, history of prior suicide attempt   Risk Reduction Factors:   Sense of responsibility to family, Living with another person, especially a relative and Positive coping skills or problem solving skills  Continued Clinical Symptoms:  At this time patient is alert, attentive, well related, pleasant, mood is improved and presents euthymic, with full range of affect . No thought disorder, no suicidal or self injurious ideations, no homicidal or violent ideations, no hallucinations, no delusions, not internally preoccupied . Future oriented . Behavior on unit calm, pleasant on approach. Denies medication side effects. Side effects discussed including potential increased risk of suicidal ideations early in treatment with antidepressants in young adults. Of note, FT3 and FT4 WNL.    Cognitive Features That Contribute To Risk:  No gross cognitive deficits noted upon discharge. Is alert , attentive, and oriented x 3   Suicide Risk:  Mild:  Suicidal ideation of limited frequency, intensity, duration, and specificity.  There are no identifiable plans, no associated intent, mild dysphoria and related symptoms, good self-control (both objective and subjective assessment), few other risk factors, and identifiable protective factors, including available and accessible social support.  Follow-up Information    [facility]. Call.   Specialty:  Behavioral Health Why:  Due to a missed appt, you must pay $130 fee before a medication appt can be rescheduled with Tamela OddiJo Hughes. Contact information: [address] [place] [id number] [phone]        [facility] Follow up.   Contact information: [address] [place] [address] [phone]           Plan Of Care/Follow-up recommendations:  Activity:  as tolerated   Diet:  Regular Tests:  NA Other:  See below  Patient is expressing readiness for discharge and is leaving in good spirits Plans to return home Plans to follow up as above- of note, states that she plans to relocate to [place] over the next few weeks or months, and states she will then set up outpatient psychiatric care there .    Craige Cotta, MD 08/23/2017, 8:29 AM

## 2017-08-25 NOTE — Progress Notes (Signed)
CSW [facility] on 2/25, 2/26, and 2/27 and left messages each time.  No return call.  CSW then spoke to OmnicomScott Terrero, father, who reported that [facility].  He has also paid the late fee and rescheduled pt for medication at [facility].  He said pt seems to be doing well. Garner NashGregory Haylen Shelnutt, MSW, LCSW Clinical Social Worker 08/25/2017 3:14 PM
